# Patient Record
Sex: Female | Born: 1970
Health system: Southern US, Community
[De-identification: ages and names within clinical notes are randomized; demographics above are authoritative.]

## PROBLEM LIST (undated history)

## (undated) DIAGNOSIS — R911 Solitary pulmonary nodule: Secondary | ICD-10-CM

## (undated) DIAGNOSIS — R319 Hematuria, unspecified: Secondary | ICD-10-CM

## (undated) HISTORY — DX: Solitary pulmonary nodule: R91.1

## (undated) HISTORY — PX: OTHER SURGICAL HISTORY: SHX169

## (undated) HISTORY — DX: Hematuria, unspecified: R31.9

---

## 1998-10-25 ENCOUNTER — Other Ambulatory Visit: Admission: RE | Admit: 1998-10-25 | Discharge: 1998-10-25 | Payer: Self-pay | Admitting: Obstetrics and Gynecology

## 1999-10-30 ENCOUNTER — Other Ambulatory Visit: Admission: RE | Admit: 1999-10-30 | Discharge: 1999-10-30 | Payer: Self-pay | Admitting: Obstetrics and Gynecology

## 2000-12-18 ENCOUNTER — Other Ambulatory Visit: Admission: RE | Admit: 2000-12-18 | Discharge: 2000-12-18 | Payer: Self-pay | Admitting: Obstetrics and Gynecology

## 2001-12-18 ENCOUNTER — Emergency Department (HOSPITAL_COMMUNITY): Admission: EM | Admit: 2001-12-18 | Discharge: 2001-12-18 | Payer: Self-pay | Admitting: *Deleted

## 2002-01-05 ENCOUNTER — Other Ambulatory Visit: Admission: RE | Admit: 2002-01-05 | Discharge: 2002-01-05 | Payer: Self-pay | Admitting: Obstetrics and Gynecology

## 2003-02-09 ENCOUNTER — Other Ambulatory Visit: Admission: RE | Admit: 2003-02-09 | Discharge: 2003-02-09 | Payer: Self-pay | Admitting: Obstetrics and Gynecology

## 2004-02-28 ENCOUNTER — Other Ambulatory Visit: Admission: RE | Admit: 2004-02-28 | Discharge: 2004-02-28 | Payer: Self-pay | Admitting: Obstetrics and Gynecology

## 2005-05-16 ENCOUNTER — Other Ambulatory Visit: Admission: RE | Admit: 2005-05-16 | Discharge: 2005-05-16 | Payer: Self-pay | Admitting: Obstetrics and Gynecology

## 2008-07-28 ENCOUNTER — Ambulatory Visit: Payer: Self-pay | Admitting: Genetic Counselor

## 2011-03-19 ENCOUNTER — Ambulatory Visit: Payer: 59 | Attending: Specialist

## 2011-03-19 DIAGNOSIS — M25519 Pain in unspecified shoulder: Secondary | ICD-10-CM | POA: Insufficient documentation

## 2011-03-19 DIAGNOSIS — IMO0001 Reserved for inherently not codable concepts without codable children: Secondary | ICD-10-CM | POA: Insufficient documentation

## 2011-03-19 DIAGNOSIS — M542 Cervicalgia: Secondary | ICD-10-CM | POA: Insufficient documentation

## 2011-03-27 ENCOUNTER — Ambulatory Visit: Payer: Self-pay | Admitting: Rehabilitative and Restorative Service Providers"

## 2011-03-27 ENCOUNTER — Ambulatory Visit: Payer: 59 | Attending: Specialist | Admitting: Rehabilitative and Restorative Service Providers"

## 2011-03-27 DIAGNOSIS — IMO0001 Reserved for inherently not codable concepts without codable children: Secondary | ICD-10-CM | POA: Insufficient documentation

## 2011-03-27 DIAGNOSIS — M542 Cervicalgia: Secondary | ICD-10-CM | POA: Insufficient documentation

## 2011-03-27 DIAGNOSIS — M25519 Pain in unspecified shoulder: Secondary | ICD-10-CM | POA: Insufficient documentation

## 2011-03-30 ENCOUNTER — Ambulatory Visit: Payer: 59 | Admitting: Rehabilitative and Restorative Service Providers"

## 2011-04-06 ENCOUNTER — Ambulatory Visit: Payer: 59 | Admitting: Rehabilitative and Restorative Service Providers"

## 2011-04-11 ENCOUNTER — Ambulatory Visit: Payer: 59 | Admitting: Physical Therapy

## 2011-04-12 ENCOUNTER — Ambulatory Visit: Payer: 59 | Admitting: Physical Therapy

## 2011-04-17 ENCOUNTER — Encounter: Payer: 59 | Admitting: Rehabilitative and Restorative Service Providers"

## 2011-04-19 ENCOUNTER — Encounter: Payer: 59 | Admitting: Rehabilitative and Restorative Service Providers"

## 2013-10-05 ENCOUNTER — Emergency Department (HOSPITAL_COMMUNITY)
Admission: EM | Admit: 2013-10-05 | Discharge: 2013-10-05 | Disposition: A | Discharge status: Home / Self Care | Payer: 59 | Source: Home / Self Care | Attending: Emergency Medicine | Admitting: Emergency Medicine

## 2013-10-05 ENCOUNTER — Encounter (HOSPITAL_COMMUNITY): Payer: Self-pay | Admitting: Emergency Medicine

## 2013-10-05 DIAGNOSIS — J019 Acute sinusitis, unspecified: Secondary | ICD-10-CM

## 2013-10-05 LAB — POCT RAPID STREP A: Streptococcus, Group A Screen (Direct): NEGATIVE

## 2013-10-05 MED ORDER — HYDROCOD POLST-CHLORPHEN POLST 10-8 MG/5ML PO LQCR: 5.0000 mL | Freq: Two times a day (BID) | ORAL | Status: DC | PRN

## 2013-10-05 MED ORDER — AMOXICILLIN-POT CLAVULANATE 875-125 MG PO TABS: 1.0000 | ORAL_TABLET | Freq: Two times a day (BID) | ORAL | Status: DC

## 2013-10-05 NOTE — Discharge Instructions (Signed)

## 2013-10-05 NOTE — ED Provider Notes (Signed)
  Chief Complaint   Chief Complaint  Patient presents with  . Cough    History of Present Illness   Leah Wu is a 43 year old female who's had a one-week history of a cough with some light green, bloody sputum, low-grade fever, nasal congestion, sinus pressure, headache, ear congestion, and sore throat. She's had no specific exposures but has flown to [place] several times.  Review of Systems   Other than as noted above, the patient denies any of the following symptoms: Systemic:  No fevers, chills, sweats, or myalgias. Eye:  No redness or discharge. ENT:  No ear pain, headache, nasal congestion, drainage, sinus pressure, or sore throat. Neck:  No neck pain, stiffness, or swollen glands. Lungs:  No cough, sputum production, hemoptysis, wheezing, chest tightness, shortness of breath or chest pain. GI:  No abdominal pain, nausea, vomiting or diarrhea.  Bliss   Past medical history, family history, social history, meds, and allergies were reviewed. Her only medication is birth control pills.  Physical exam   Vital signs:  BP 125/64  Pulse 76  Temp(Src) 98.5 F (36.9 C) (Oral)  Resp 16  SpO2 100%  LMP 08/05/2013 General:  Alert and oriented.  In no distress.  Skin warm and dry. Eye:  No conjunctival injection or drainage. Lids were normal. ENT:  TMs and canals were normal, without erythema or inflammation.  Nasal mucosa was clear and uncongested, without drainage.  Mucous membranes were moist.  Pharynx was erythematous with no exudate or drainage.  There were no oral ulcerations or lesions. Neck:  Supple, no adenopathy, tenderness or mass. Lungs:  No respiratory distress.  Lungs were clear to auscultation, without wheezes, rales or rhonchi.  Breath sounds were clear and equal bilaterally.  Heart:  Regular rhythm, without gallops, murmers or rubs. Skin:  Clear, warm, and dry, without rash or lesions.  Labs   Results for orders placed during the hospital encounter of  10/05/13  POCT RAPID STREP A ([facility] C[facility] ONLY)      Result Value Range   Streptococcus, Group A Screen (Direct) NEGATIVE  NEGATIVE    Assessment     The encounter diagnosis was Acute sinusitis.  Plan    1.  Meds:  The following meds were prescribed:   Discharge Medication List as of 10/05/2013  4:05 PM    START taking these medications   Details  amoxicillin-clavulanate (AUGMENTIN) 875-125 MG per tablet Take 1 tablet by mouth 2 (two) times daily., Starting 10/05/2013, Until Discontinued, Normal    chlorpheniramine-HYDROcodone (TUSSIONEX) 10-8 MG/5ML LQCR Take 5 mLs by mouth every 12 (twelve) hours as needed for cough., Starting 10/05/2013, Until Discontinued, Normal        2.  Patient Education/Counseling:  The patient was given appropriate handouts, self care instructions, and instructed in symptomatic relief.  Instructed to get extra fluids, rest, and use a cool mist vaporizer.   3.  Follow up:  The patient was told to follow up here if no better in 3 to 4 days, or sooner if becoming worse in any way, and given some red flag symptoms such as increasing fever, difficulty breathing, chest pain, or persistent vomiting which would prompt immediate return.  Follow up here as needed.      Harden Mo, MD 10/05/13 [phone]

## 2013-10-05 NOTE — ED Notes (Signed)
Pt  Reports  Symptoms  Of  Cough   sorethroat     -  Pt  Reports   Symptoms  Persisting  X  2  Weeks   Cough  Is  Now  Becoming  Productive

## 2013-10-07 LAB — CULTURE, GROUP A STREP

## 2013-11-30 ENCOUNTER — Ambulatory Visit (INDEPENDENT_AMBULATORY_CARE_PROVIDER_SITE_OTHER): Payer: Commercial Managed Care - PPO | Admitting: Emergency Medicine

## 2013-11-30 ENCOUNTER — Ambulatory Visit: Payer: Commercial Managed Care - PPO

## 2013-11-30 VITALS — BP 110/72 | HR 70 | Temp 98.9°F | Resp 18 | Ht 64.0 in | Wt 132.0 lb

## 2013-11-30 DIAGNOSIS — M79676 Pain in unspecified toe(s): Secondary | ICD-10-CM

## 2013-11-30 DIAGNOSIS — M79609 Pain in unspecified limb: Secondary | ICD-10-CM

## 2013-11-30 DIAGNOSIS — S92919A Unspecified fracture of unspecified toe(s), initial encounter for closed fracture: Secondary | ICD-10-CM

## 2013-11-30 NOTE — Progress Notes (Signed)
[facility] [address] [place] [address] [phone]  Date:  11/30/2013   Name:  Leah Wu   DOB:  1971/03/28   MRN:  [id number]  PCP:  Darlyn Chamber, MD    Chief Complaint: Toe Injury   History of Present Illness:  Leah Wu is a 43 y.o. very pleasant female patient who presents with the following:  Dropped a piece of firewood on her right small toe a week ago.  Swelling and ecchymosis is resolved.  Still having significant pain the toe.  Increases with weight bearing and standing.  No improvement with over the counter medications or other home remedies.  Denies other complaint or health concern today.   There are no active problems to display for this patient.   History reviewed. No pertinent past medical history.  Past Surgical History  Procedure Laterality Date  . Lasix      History  Substance Use Topics  . Smoking status: Never Smoker   . Smokeless tobacco: Not on file  . Alcohol Use: No    Family History  Problem Relation Age of Onset  . Atrial fibrillation Mother   . Hypertension Father   . Hyperlipidemia Father   . Heart disease Father   . Cancer Sister   . Stroke Maternal Grandfather   . Diabetes Maternal Grandfather   . Cancer Paternal Grandmother   . Stroke Paternal Grandfather     Allergies  Allergen Reactions  . Sulfur     Medication list has been reviewed and updated.  Current Outpatient Prescriptions on File Prior to Visit  Medication Sig Dispense Refill  . amoxicillin-clavulanate (AUGMENTIN) 875-125 MG per tablet Take 1 tablet by mouth 2 (two) times daily.  20 tablet  0  . chlorpheniramine-HYDROcodone (TUSSIONEX) 10-8 MG/5ML LQCR Take 5 mLs by mouth every 12 (twelve) hours as needed for cough.  60 mL  0   No current facility-administered medications on file prior to visit.    Review of Systems:  As per HPI, otherwise negative.    Physical Examination: Filed Vitals:   11/30/13 1434  BP:  110/72  Pulse: 70  Temp: 98.9 F (37.2 C)  Resp: 18   Filed Vitals:   11/30/13 1434  Height: 5\' 4"  (1.626 m)  Weight: 132 lb (59.875 kg)   Body mass index is 22.65 kg/(m^2). Ideal Body Weight: Weight in (lb) to have BMI = 25: 145.3   GEN: WDWN, NAD, Non-toxic, Alert & Oriented x 3 HEENT: Atraumatic, Normocephalic.  Ears and Nose: No external deformity. EXTR: No clubbing/cyanosis/edema NEURO: Normal gait.  PSYCH: Normally interactive. Conversant. Not depressed or anxious appearing.  Calm demeanor.  Right foot:  Tender small toe.  No deformity.  Assessment and Plan: Fractured small toe Buddy tape  Signed,  Ellison Carwin, MD  [facility] reading (PRIMARY) by  Dr. Ouida Sills.  Fractured toe .

## 2013-11-30 NOTE — Patient Instructions (Signed)
Toe Fracture  Your caregiver has diagnosed you as having a fractured toe. A toe fracture is a break in the bone of a toe. "Buddy taping" is a way of splinting your broken toe, by taping the broken toe to the toe next to it. This "buddy taping" will keep the injured toe from moving beyond normal range of motion. Buddy taping also helps the toe heal in a more normal alignment. It may take 6 to 8 weeks for the toe injury to heal.  HOME CARE INSTRUCTIONS    Leave your toes taped together for as long as directed by your caregiver or until you see a doctor for a follow-up examination. You can change the tape after bathing. Always use a small piece of gauze or cotton between the toes when taping them together. This will help the skin stay dry and prevent infection.   Apply ice to the injury for 15-20 minutes each hour while awake for the first 2 days. Put the ice in a plastic bag and place a towel between the bag of ice and your skin.   After the first 2 days, apply heat to the injured area. Use heat for the next 2 to 3 days. Place a heating pad on the foot or soak the foot in warm water as directed by your caregiver.   Keep your foot elevated as much as possible to lessen swelling.   Wear sturdy, supportive shoes. The shoes should not pinch the toes or fit tightly against the toes.   Your caregiver may prescribe a rigid shoe if your foot is very swollen.   Your may be given crutches if the pain is too great and it hurts too much to walk.   Only take over-the-counter or prescription medicines for pain, discomfort, or fever as directed by your caregiver.   If your caregiver has given you a follow-up appointment, it is very important to keep that appointment. Not keeping the appointment could result in a chronic or permanent injury, pain, and disability. If there is any problem keeping the appointment, you must call back to this facility for assistance.  SEEK MEDICAL CARE IF:    You have increased pain or swelling,  not relieved with medications.   The pain does not get better after 1 week.   Your injured toe is cold when the others are warm.  SEEK IMMEDIATE MEDICAL CARE IF:    The toe becomes cold, numb, or white.   The toe becomes hot (inflamed) and red.  Document Released: 09/07/2000 Document Revised: 12/03/2011 Document Reviewed: 04/26/2008  ExitCare Patient Information 2014 [employer].

## 2014-03-02 ENCOUNTER — Encounter: Payer: Self-pay | Admitting: Podiatry

## 2014-03-02 ENCOUNTER — Ambulatory Visit (INDEPENDENT_AMBULATORY_CARE_PROVIDER_SITE_OTHER): Payer: Commercial Managed Care - PPO

## 2014-03-02 ENCOUNTER — Ambulatory Visit (INDEPENDENT_AMBULATORY_CARE_PROVIDER_SITE_OTHER): Payer: Commercial Managed Care - PPO | Admitting: Podiatry

## 2014-03-02 VITALS — BP 121/61 | HR 78 | Resp 16 | Ht 64.0 in | Wt 154.0 lb

## 2014-03-02 DIAGNOSIS — L600 Ingrowing nail: Secondary | ICD-10-CM

## 2014-03-02 DIAGNOSIS — S92919A Unspecified fracture of unspecified toe(s), initial encounter for closed fracture: Secondary | ICD-10-CM

## 2014-03-02 DIAGNOSIS — L608 Other nail disorders: Secondary | ICD-10-CM

## 2014-03-02 NOTE — Progress Notes (Signed)
   Subjective:    Patient ID: Leah Wu, female    DOB: 1971-01-17, 43 y.o.   MRN: [id number]  HPI Comments: "I have a bad nail"  Patient c/o tender 2nd toe left for few weeks. The nail is discolored and thick. She is an active hiker and feels the toes hitting the ends of her shoes sometimes. The nail was loose last night so she trimmed it down.   Also, she broke the 5th toe right in March 2015 and still appears swollen and red and sore sometimes with shoes.     Review of Systems  All other systems reviewed and are negative.      Objective:   Physical Exam: I have reviewed her past medical history medications allergies surgeries social history and review of systems. Pulses are strongly palpable bilateral. Neurologic sensorium is intact per since once the monofilament. Deep tendon reflexes are intact brisk and equal bilateral. Muscle strength is 5 over 5 dorsiflexors plantar flexors inverters everters all intrinsic musculature is intact. Orthopedic evaluation Mr. is all joints distal to the ankle a full range of motion without crepitus mild flexible hammertoe deformities are noted bilateral but are asymptomatic. Radiographic evaluation of the foot demonstrates a compression fracture of the head of the proximal phalanx of the right foot is still healing. It is not dislocated and is not comminuted. Cutaneous evaluation demonstrates supple well hydrated cutis with exception of the nail plates to the second digit of the left foot which is short and discolored and appears to be sharply incurvated. The remainder of her nail plates are nice and flat clear and clean. Second digit of the left foot demonstrates minimal callus to the distal aspect of the toe. She appears to have some bleeding and what is remaining of the nail plate. And it is of a [place]:.        Assessment & Plan:  Assessment: Fracture fifth digit right foot non-comminuted nondisplaced healing. Painful ingrown nail second  digit of the left foot with probable nail dystrophy and possible onychomycosis.  Plan: Discussed etiology pathology conservative versus surgical therapies at this point I performed a total nail avulsion to the second digit of the left foot this is temporary in nature and we sent the nail plate for biopsy. I will followup with her in a week or so just to reevaluate the toe to make sure that is healing well before she leaves on her trip to [place].

## 2014-03-02 NOTE — Patient Instructions (Signed)

## 2014-03-05 ENCOUNTER — Telehealth: Payer: Self-pay | Admitting: *Deleted

## 2014-03-05 NOTE — Telephone Encounter (Signed)
Nail Fragment 2nd left sent to [facility] for fungal culture/ PAS stain to rule out Fungus.   [facility] [address] [place] [address] Ph: [phone]

## 2014-03-09 ENCOUNTER — Ambulatory Visit (INDEPENDENT_AMBULATORY_CARE_PROVIDER_SITE_OTHER): Payer: Commercial Managed Care - PPO | Admitting: Podiatry

## 2014-03-09 ENCOUNTER — Encounter: Payer: Self-pay | Admitting: Podiatry

## 2014-03-09 VITALS — BP 102/78 | HR 71 | Resp 16 | Wt 136.6 lb

## 2014-03-09 DIAGNOSIS — L608 Other nail disorders: Secondary | ICD-10-CM

## 2014-03-09 NOTE — Progress Notes (Signed)
She presents today for followup of total nail avulsion with biopsy second digit left foot. She denies fever chills nausea vomiting muscle aches and pains it seems to be doing pretty well. She continues to soak in Betadine warm water to yesterday were she started Epsom salts and water soaks. States it seems to be doing well she covers daily with antibiotic ointment and a Band-Aid.  Objective: Vital signs are stable she is alert and oriented x3 his second digit left foot does not demonstrate any erythema edema saline is drainage or odor and appears to be healing nicely.  Assessment: Well-healing surgical toe second left.  Plan: Continue to soak Epsom salts warm water twice a day and leave open at night covered in today with a small amount of Aquaphor and a Band-Aid. We will notify her once her biopsy comes in and we will have her into the office to discuss our options.

## 2014-03-24 ENCOUNTER — Encounter: Payer: Self-pay | Admitting: Podiatry

## 2014-03-30 ENCOUNTER — Encounter: Payer: Self-pay | Admitting: Podiatry

## 2014-03-30 ENCOUNTER — Ambulatory Visit (INDEPENDENT_AMBULATORY_CARE_PROVIDER_SITE_OTHER): Payer: Commercial Managed Care - PPO | Admitting: Podiatry

## 2014-03-30 ENCOUNTER — Telehealth: Payer: Self-pay | Admitting: *Deleted

## 2014-03-30 VITALS — BP 119/70 | HR 66 | Resp 16

## 2014-03-30 DIAGNOSIS — Z79899 Other long term (current) drug therapy: Secondary | ICD-10-CM

## 2014-03-30 LAB — HEPATIC FUNCTION PANEL
ALT: 12 U/L (ref 0–35)
AST: 17 U/L (ref 0–37)
Albumin: 4 g/dL (ref 3.5–5.2)
Alkaline Phosphatase: 56 U/L (ref 39–117)
Bilirubin, Direct: 0.1 mg/dL (ref 0.0–0.3)
Indirect Bilirubin: 0.3 mg/dL (ref 0.2–1.2)
Total Bilirubin: 0.4 mg/dL (ref 0.2–1.2)
Total Protein: 6.6 g/dL (ref 6.0–8.3)

## 2014-03-30 MED ORDER — TERBINAFINE HCL 250 MG PO TABS: 250.0000 mg | ORAL_TABLET | Freq: Every day | ORAL | Status: DC

## 2014-03-30 NOTE — Telephone Encounter (Signed)
I'm not sure which pharmacy I have on my chart, I want to confirm.  I moved from [place] back to [place].  I know he's going to call in a prescription for me.  Give me a quick call to confirm that.  I called and left her a message that we sent the prescription to [facility].  Call if you have any further questions.

## 2014-03-30 NOTE — Patient Instructions (Signed)

## 2014-03-30 NOTE — Progress Notes (Signed)
She presents today for discussion regarding her fungal results. She states that her second toe appears to be healing quite nicely.  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  Assessment: Onychomycosis second digit left foot with nail dystrophy.  Plan: Treatment with terbinafine. We performed a liver profile should this come back abnormal I will notify her immediately remember to ask her how her trip to [place] went.

## 2014-03-31 ENCOUNTER — Telehealth: Payer: Self-pay | Admitting: *Deleted

## 2014-03-31 NOTE — Telephone Encounter (Signed)
Message copied by Rip Harbour on Wed Mar 31, 2014  1:44 PM ------      Message from: Tyson Dense T      Created: Wed Mar 31, 2014 11:47 AM       Blood work looks good. Continue medication. ------

## 2014-03-31 NOTE — Telephone Encounter (Signed)
Called patient to let her know that her labs were WNL and okay to start medication.

## 2014-05-04 ENCOUNTER — Ambulatory Visit: Payer: Commercial Managed Care - PPO | Admitting: Podiatry

## 2014-05-07 ENCOUNTER — Telehealth: Payer: Self-pay | Admitting: *Deleted

## 2014-05-07 NOTE — Telephone Encounter (Signed)
I returned her call and informed her she will need to schedule an appointment to see Dr. Milinda Pointer.  She said it just seems like I've had a whole lot of appointments lately and I don't want the insurance to stop covering it.  She said, Do I need to call back tomorrow to schedule an appointment?  I told her no, call on Monday.  She stated okay.

## 2014-05-07 NOTE — Telephone Encounter (Signed)
I missed an appointment this week.  Do we actually need an office visit or can I just get him to give orders to have blood drawn?

## 2014-05-11 ENCOUNTER — Encounter: Payer: Self-pay | Admitting: Podiatry

## 2014-05-11 ENCOUNTER — Ambulatory Visit (INDEPENDENT_AMBULATORY_CARE_PROVIDER_SITE_OTHER): Payer: Commercial Managed Care - PPO | Admitting: Podiatry

## 2014-05-11 VITALS — BP 114/87 | HR 68 | Resp 16

## 2014-05-11 DIAGNOSIS — Z79899 Other long term (current) drug therapy: Secondary | ICD-10-CM

## 2014-05-11 MED ORDER — TERBINAFINE HCL 250 MG PO TABS
250.0000 mg | ORAL_TABLET | Freq: Every day | ORAL | Status: DC
Start: 2014-05-11 — End: 2014-10-25

## 2014-05-11 NOTE — Progress Notes (Signed)
She presents today one month status post taking Lamisil for onychomycosis. She denies fever chills nausea vomiting muscle aches and pains itching and rashes. She's finished her first bottle of Lamisil.  Objective: Vital signs are stable she is alert and oriented x3. No change in nail plate second digit of the left foot as of yet.  Assessment: Start her second dose of Lamisil 250 mg #90. She also received a blood requisition form for liver profile. Should this come back abnormal I will notify her immediately otherwise I will followup with her in 4 months area

## 2014-05-12 LAB — HEPATIC FUNCTION PANEL
ALBUMIN: 3.9 g/dL (ref 3.5–5.2)
ALT: 8 U/L (ref 0–35)
AST: 14 U/L (ref 0–37)
Alkaline Phosphatase: 59 U/L (ref 39–117)
BILIRUBIN INDIRECT: 0.2 mg/dL (ref 0.2–1.2)
Bilirubin, Direct: 0.1 mg/dL (ref 0.0–0.3)
Total Bilirubin: 0.3 mg/dL (ref 0.2–1.2)
Total Protein: 6.8 g/dL (ref 6.0–8.3)

## 2014-05-12 LAB — CBC WITH DIFFERENTIAL/PLATELET
Basophils Absolute: 0 10*3/uL (ref 0.0–0.1)
Basophils Relative: 0 % (ref 0–1)
Eosinophils Absolute: 0.1 10*3/uL (ref 0.0–0.7)
Eosinophils Relative: 1 % (ref 0–5)
HCT: 40.9 % (ref 36.0–46.0)
HEMOGLOBIN: 13.6 g/dL (ref 12.0–15.0)
LYMPHS ABS: 2.2 10*3/uL (ref 0.7–4.0)
LYMPHS PCT: 32 % (ref 12–46)
MCH: 28.9 pg (ref 26.0–34.0)
MCHC: 33.3 g/dL (ref 30.0–36.0)
MCV: 86.8 fL (ref 78.0–100.0)
MONOS PCT: 9 % (ref 3–12)
Monocytes Absolute: 0.6 10*3/uL (ref 0.1–1.0)
NEUTROS ABS: 4 10*3/uL (ref 1.7–7.7)
NEUTROS PCT: 58 % (ref 43–77)
Platelets: 297 10*3/uL (ref 150–400)
RBC: 4.71 MIL/uL (ref 3.87–5.11)
RDW: 12.8 % (ref 11.5–15.5)
WBC: 6.9 10*3/uL (ref 4.0–10.5)

## 2014-05-19 ENCOUNTER — Telehealth: Payer: Self-pay | Admitting: *Deleted

## 2014-05-19 NOTE — Telephone Encounter (Signed)
Spoke to patient regarding her blood work results

## 2014-05-19 NOTE — Telephone Encounter (Signed)
Message copied by Dierdre Searles on Wed May 19, 2014  8:53 AM ------      Message from: Tyson Dense T      Created: Mon May 17, 2014 12:33 PM       Blood work looks good continue medication. ------

## 2014-06-23 ENCOUNTER — Encounter: Payer: Self-pay | Admitting: Sports Medicine

## 2014-06-23 ENCOUNTER — Ambulatory Visit (INDEPENDENT_AMBULATORY_CARE_PROVIDER_SITE_OTHER): Payer: 59 | Admitting: Sports Medicine

## 2014-06-23 VITALS — BP 119/76 | Ht 64.0 in | Wt 134.0 lb

## 2014-06-23 DIAGNOSIS — M629 Disorder of muscle, unspecified: Secondary | ICD-10-CM

## 2014-06-23 DIAGNOSIS — M7632 Iliotibial band syndrome, left leg: Secondary | ICD-10-CM

## 2014-06-23 DIAGNOSIS — M2420 Disorder of ligament, unspecified site: Secondary | ICD-10-CM

## 2014-06-23 DIAGNOSIS — M7630 Iliotibial band syndrome, unspecified leg: Secondary | ICD-10-CM | POA: Insufficient documentation

## 2014-06-23 NOTE — Patient Instructions (Signed)
1. Hip abduction exercise: 6-8 times twice daily.  2. Standing hip rotation exercise: 6-8 times twice daily.  3. Lateral step-ups: 6-8 times twice daily.  4. Crossover step-ups: 6-8 times twice daily.  5. Partial side planks: Hold for 5 seconds, back down, relax. Repeat 6-8 times once daily for 2 weeks, then twice daily.  6. 30 yard lateral shuffle 5 times back and forth at the end of runs approximately 2-3 times per week  -Plan follow-up in 2 months.

## 2014-06-23 NOTE — Progress Notes (Signed)
   Subjective:    Patient ID: Leah Wu, female    DOB: [birth year], 43 y.o.   MRN: [id number]  HPI Leah Wu is a 43yo female new patient who presents with bilateral, though left greater than right knee pain. Onset was 2 weeks ago when she was running on the beach and jumped over a gully on the beach, landing awkwardly on the left knee. She felt she landed in a slightly twisted manner, causing her to fall to the ground, striking both her knees anteriorly on the sand. She was able to continue running after this happened. She denies any swelling, giving way, or locking in the left knee. Location of pain is primarily antero-infero-lateral left knee in the region in which the IT band inserts. She seeks an opinion about getting back to running. She has tried icing and an OTC brace with some relief. Jumping or running may aggravate her symptoms. She denies any pain that wakes her from sleep. She takes occasional Aleeve. She denies any prior knee injuries or surgeries or any other PMHx.  Past medical history, social history, medications, and allergies were reviewed and are up to date in the chart.  Review of Systems 7 point review of systems was performed and was otherwise negative unless noted in the history of present illness.    Objective:   Physical Exam BP 119/76  Ht 5\' 4"  (1.626 m)  Wt 134 lb (60.782 kg)  BMI 22.99 kg/m2 GEN: The patient is well-developed well-nourished female and in no acute distress.  She is awake alert and oriented x3. SKIN: warm and well-perfused, no rash  EXTR: No lower extremity edema or calf tenderness Neuro: Strength 5/5 globally. Sensation intact throughout. DTRs 2/4 bilaterally. No focal deficits. Vasc: +2 bilateral distal pulses. No edema.  MSK: Examination of the bilateral knees reveals no obvious swelling or overlying erythema. ROM is from 0-140 bilaterally without pain. Examination of the right knee is unremarkable. Examination of the left knee reveals  TTP over the infero-lateral insertion of the IT band and infero-medial insertion of the pes anserine region. Less TTP at [facility]. No laxity of LCL or MCL. Neg lachman's. Good hamstring strength. No patellar ballottment. Patellar and Quad Tendons grossly intact. No joint line tenderness. +Weak hip abductors bilaterally with leg bend and straight. +Quad weakness bilaterally.     Assessment & Plan:  Please see problem based assessment and plan in the problem list.

## 2014-06-23 NOTE — Assessment & Plan Note (Signed)
1. Hip abduction exercise: 6-8 times twice daily.  2. Standing hip rotation exercise: 6-8 times twice daily.  3. Lateral step-ups: 6-8 times twice daily.  4. Crossover step-ups: 6-8 times twice daily.  5. Partial side planks: Hold for 5 seconds, back down, relax. Repeat 6-8 times once daily for 2 weeks, then twice daily.  6. 30 yard lateral shuffle 5 times back and forth at the end of runs approximately 2-3 times per week  -Plan follow-up in 2 months.

## 2014-08-09 ENCOUNTER — Ambulatory Visit (INDEPENDENT_AMBULATORY_CARE_PROVIDER_SITE_OTHER): Payer: Commercial Managed Care - PPO | Admitting: Family Medicine

## 2014-08-09 ENCOUNTER — Encounter: Payer: Self-pay | Admitting: Family Medicine

## 2014-08-09 VITALS — BP 118/74 | HR 66 | Temp 98.6°F | Resp 16 | Ht 64.5 in | Wt 131.0 lb

## 2014-08-09 DIAGNOSIS — J029 Acute pharyngitis, unspecified: Secondary | ICD-10-CM

## 2014-08-09 DIAGNOSIS — F329 Major depressive disorder, single episode, unspecified: Secondary | ICD-10-CM

## 2014-08-09 DIAGNOSIS — R110 Nausea: Secondary | ICD-10-CM

## 2014-08-09 DIAGNOSIS — F32A Depression, unspecified: Secondary | ICD-10-CM | POA: Insufficient documentation

## 2014-08-09 LAB — CBC WITH DIFFERENTIAL/PLATELET
BASOS PCT: 0 % (ref 0–1)
Basophils Absolute: 0 10*3/uL (ref 0.0–0.1)
Eosinophils Absolute: 0.1 10*3/uL (ref 0.0–0.7)
Eosinophils Relative: 1 % (ref 0–5)
HCT: 41.3 % (ref 36.0–46.0)
HEMOGLOBIN: 13.8 g/dL (ref 12.0–15.0)
Lymphocytes Relative: 20 % (ref 12–46)
Lymphs Abs: 2 10*3/uL (ref 0.7–4.0)
MCH: 29.6 pg (ref 26.0–34.0)
MCHC: 33.4 g/dL (ref 30.0–36.0)
MCV: 88.6 fL (ref 78.0–100.0)
MONOS PCT: 6 % (ref 3–12)
MPV: 10.7 fL (ref 9.4–12.4)
Monocytes Absolute: 0.6 10*3/uL (ref 0.1–1.0)
NEUTROS ABS: 7.2 10*3/uL (ref 1.7–7.7)
NEUTROS PCT: 73 % (ref 43–77)
Platelets: 270 10*3/uL (ref 150–400)
RBC: 4.66 MIL/uL (ref 3.87–5.11)
RDW: 13.5 % (ref 11.5–15.5)
WBC: 9.9 10*3/uL (ref 4.0–10.5)

## 2014-08-09 LAB — COMPREHENSIVE METABOLIC PANEL
ALBUMIN: 3.9 g/dL (ref 3.5–5.2)
ALK PHOS: 68 U/L (ref 39–117)
ALT: 12 U/L (ref 0–35)
AST: 17 U/L (ref 0–37)
BILIRUBIN TOTAL: 0.3 mg/dL (ref 0.2–1.2)
BUN: 7 mg/dL (ref 6–23)
CO2: 26 mEq/L (ref 19–32)
Calcium: 8.8 mg/dL (ref 8.4–10.5)
Chloride: 102 mEq/L (ref 96–112)
Creat: 0.86 mg/dL (ref 0.50–1.10)
GLUCOSE: 83 mg/dL (ref 70–99)
POTASSIUM: 4 meq/L (ref 3.5–5.3)
SODIUM: 137 meq/L (ref 135–145)
Total Protein: 7 g/dL (ref 6.0–8.3)

## 2014-08-09 LAB — POCT RAPID STREP A (OFFICE): RAPID STREP A SCREEN: NEGATIVE

## 2014-08-09 MED ORDER — FLUOXETINE HCL 20 MG PO TABS: 20.0000 mg | ORAL_TABLET | Freq: Every day | ORAL | Status: DC

## 2014-08-09 MED ORDER — ONDANSETRON HCL 8 MG PO TABS: 8.0000 mg | ORAL_TABLET | Freq: Three times a day (TID) | ORAL | Status: DC | PRN

## 2014-08-09 NOTE — Progress Notes (Signed)
[facility] [address] [place] [address] [phone]  Date:  08/09/2014   Name:  Leah Wu   DOB:  Sep 05, 1971   MRN:  [id number]  PCP:  Darlyn Chamber, MD    Chief Complaint: Sore Throat; Nausea; Fever; and Cough   History of Present Illness:  Leah Wu is a 43 y.o. very pleasant female patient who presents with the following:  Here today with illness.  She is generally in good health.  This is monday- on Friday she noted onset of a ST.  However her sx have changed over the last few days.  Her throat feels "thick," and she has noted nausea, PND, and a fever up to 99.9 over the weekend.  Mild cough- not much.   No vomiting but she has "gotten really really close."  She notes poor appetite.   She has had some loose stools.   No sick contacts.  LMP a couple of weeks ago- she feels very certain that she could not be pregnant Never a smoker.    She has noted some more stress in her life recently.  She has noted some milder sx of depression and is feeling lonely over the last several months.  She has gotten divorced, moved and changed jobs recently.  She does not have any SI, but admits she just wants to sleep more. She does not feel motivated to go out and do things for fun.  Sometimes tearful.  She does not drink or use any drugs.  She would be interested in trying a medication for depression such as an SSRI.  There is a family history of substance abuse so she wants to avoid anything addictive if at all possible   Patient Active Problem List   Diagnosis Date Noted  . 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  Past Surgical History  Procedure Laterality Date  . Lasix      History  Substance Use Topics  . Smoking status: Never Smoker   . Smokeless tobacco: Not on file  . Alcohol Use: No    Family History  Problem Relation Age of Onset  . Atrial fibrillation Mother   . Hypertension Father   . Hyperlipidemia  Father   . Heart disease Father   . Cancer Sister   . Stroke Maternal Grandfather   . Diabetes Maternal Grandfather   . Cancer Paternal Grandmother   . Stroke Paternal Grandfather     Allergies  Allergen Reactions  . Sulfur     Medication list has been reviewed and updated.  Current Outpatient Prescriptions on File Prior to Visit  Medication Sig Dispense Refill  . norgestrel-ethinyl estradiol (LO/OVRAL,CRYSELLE) 0.3-30 MG-MCG tablet Take 1 tablet by mouth daily.    Marland Kitchen terbinafine (LAMISIL) 250 MG tablet Take 1 tablet (250 mg total) by mouth daily. 90 tablet 0   No current facility-administered medications on file prior to visit.    Review of Systems:  As per HPI- otherwise negative. She has used some tylenol OTC- last dose was last night She has noted some decreased appetite over the last few weeks.  She is just finishing up a 4 month course of lamisil for toenail fungus.  No abdominal pain   Physical Examination: Filed Vitals:   08/09/14 0931  BP: 118/74  Pulse: 66  Temp: 98.6 F (37 C)  Resp: 16   Filed Vitals:   08/09/14 0931  Height: 5' 4.5" (  1.638 m)  Weight: 131 lb (59.421 kg)   Body mass index is 22.15 kg/(m^2). Ideal Body Weight: Weight in (lb) to have BMI = 25: 147.6  GEN: WDWN, NAD, Non-toxic, A & O x 3, looks well HEENT: Atraumatic, Normocephalic. Neck supple. No masses, No LAD.  Bilateral TM wnl, oropharynx normal.  PEERL,EOMI.   Ears and Nose: No external deformity. CV: RRR, No M/G/R. No JVD. No thrill. No extra heart sounds. PULM: CTA B, no wheezes, crackles, rhonchi. No retractions. No resp. distress. No accessory muscle use. ABD: S, NT, ND. No rebound. No HSM. EXTR: No c/c/e NEURO Normal gait.  PSYCH: Normally interactive. Conversant. Not depressed or anxious appearing.  Calm demeanor.   Results for orders placed or performed in visit on 08/09/14  POCT rapid strep A  Result Value Ref Range   Rapid Strep A Screen Negative Negative     Assessment and Plan: Acute pharyngitis, unspecified pharyngitis type - Plan: POCT rapid strep A, Culture, Group A Strep  Nausea without vomiting - Plan: CBC with Differential, Comprehensive metabolic panel, ondansetron (ZOFRAN) 8 MG tablet  Depression - Plan: FLUoxetine (PROZAC) 20 MG tablet  Reassured- seems to have a viral URI.  Wait throat culture and treat sx as needed Await CMP to ensure her lfts are ok as she is on lamisil She would like to start medication for depression.  Will start prozac at 20 mg, increase to 40 as needed See patient instructions for more details.     Signed Lamar Blinks, MD

## 2014-08-09 NOTE — Patient Instructions (Addendum)
I will be in touch with your labs.  Rest, drink plenty of fluids, and use ibuprofen or tylenol as needed You can use the zofran as needed for nausea.  Let me know if you have any abdominal pain or if you are otherwise getting worse.    Also, we are going to start prozac for your depression.  Start at 20 mg and increase to 40 after a couple of weeks.   Make sure to exercise, get some sunshine daily, and make plans with friends- even if you don't really want to! Please touch base with me in about 1 month and let me know how you are doing

## 2014-08-11 ENCOUNTER — Encounter: Payer: Self-pay | Admitting: Family Medicine

## 2014-08-11 LAB — CULTURE, GROUP A STREP: Organism ID, Bacteria: NORMAL

## 2014-08-16 ENCOUNTER — Ambulatory Visit (INDEPENDENT_AMBULATORY_CARE_PROVIDER_SITE_OTHER): Payer: Commercial Managed Care - PPO | Admitting: Family Medicine

## 2014-08-16 ENCOUNTER — Encounter: Payer: Self-pay | Admitting: Family Medicine

## 2014-08-16 VITALS — BP 118/66 | HR 77 | Temp 98.8°F | Resp 16 | Ht 64.5 in | Wt 128.2 lb

## 2014-08-16 DIAGNOSIS — R634 Abnormal weight loss: Secondary | ICD-10-CM

## 2014-08-16 DIAGNOSIS — K2970 Gastritis, unspecified, without bleeding: Secondary | ICD-10-CM

## 2014-08-16 DIAGNOSIS — B370 Candidal stomatitis: Secondary | ICD-10-CM

## 2014-08-16 DIAGNOSIS — R110 Nausea: Secondary | ICD-10-CM

## 2014-08-16 LAB — THYROID PANEL WITH TSH
Free Thyroxine Index: 2.5 (ref 1.4–3.8)
T3 Uptake: 29 % (ref 22–35)
T4, Total: 8.5 ug/dL (ref 4.5–12.0)
TSH: 0.785 u[IU]/mL (ref 0.350–4.500)

## 2014-08-16 MED ORDER — NYSTATIN 100000 UNIT/ML MT SUSP: 5.0000 mL | Freq: Four times a day (QID) | OROMUCOSAL | Status: DC

## 2014-08-16 NOTE — Patient Instructions (Signed)
Take zantac twice a day Start probiotic    Gastritis, Adult Gastritis is soreness and swelling (inflammation) of the lining of the stomach. Gastritis can develop as a sudden onset (acute) or long-term (chronic) condition. If gastritis is not treated, it can lead to stomach bleeding and ulcers. CAUSES  Gastritis occurs when the stomach lining is weak or damaged. Digestive juices from the stomach then inflame the weakened stomach lining. The stomach lining may be weak or damaged due to viral or bacterial infections. One common bacterial infection is the Helicobacter pylori infection. Gastritis can also result from excessive alcohol consumption, taking certain medicines, or having too much acid in the stomach.  SYMPTOMS  In some cases, there are no symptoms. When symptoms are present, they may include:  Pain or a burning sensation in the upper abdomen.  Nausea.  Vomiting.  An uncomfortable feeling of fullness after eating. DIAGNOSIS  Your caregiver may suspect you have gastritis based on your symptoms and a physical exam. To determine the cause of your gastritis, your caregiver may perform the following:  Blood or stool tests to check for the H pylori bacterium.  Gastroscopy. A thin, flexible tube (endoscope) is passed down the esophagus and into the stomach. The endoscope has a light and camera on the end. Your caregiver uses the endoscope to view the inside of the stomach.  Taking a tissue sample (biopsy) from the stomach to examine under a microscope. TREATMENT  Depending on the cause of your gastritis, medicines may be prescribed. If you have a bacterial infection, such as an H pylori infection, antibiotics may be given. If your gastritis is caused by too much acid in the stomach, H2 blockers or antacids may be given. Your caregiver may recommend that you stop taking aspirin, ibuprofen, or other nonsteroidal anti-inflammatory drugs (NSAIDs). HOME CARE INSTRUCTIONS  Only take  over-the-counter or prescription medicines as directed by your caregiver.  If you were given antibiotic medicines, take them as directed. Finish them even if you start to feel better.  Drink enough fluids to keep your urine clear or pale yellow.  Avoid foods and drinks that make your symptoms worse, such as:  Caffeine or alcoholic drinks.  Chocolate.  Peppermint or mint flavorings.  Garlic and onions.  Spicy foods.  Citrus fruits, such as oranges, lemons, or limes.  Tomato-based foods such as sauce, chili, salsa, and pizza.  Fried and fatty foods.  Eat small, frequent meals instead of large meals. SEEK IMMEDIATE MEDICAL CARE IF:   You have black or dark red stools.  You vomit blood or material that looks like coffee grounds.  You are unable to keep fluids down.  Your abdominal pain gets worse.  You have a fever.  You do not feel better after 1 week.  You have any other questions or concerns. MAKE SURE YOU:  Understand these instructions.  Will watch your condition.  Will get help right away if you are not doing well or get worse. Document Released: 09/04/2001 Document Revised: 03/11/2012 Document Reviewed: 10/24/2011 [facility] Patient Information 2015 [place], [place]. This information is not intended to replace advice given to you by your health care provider. Make sure you discuss any questions you have with your health care provider.

## 2014-08-16 NOTE — Progress Notes (Signed)
Subjective:     Patient ID: Leah Wu, female   DOB: [birth year], 43 y.o.   MRN: [id number]  HPI Patient presents today for follow up of nausea and weight loss. She continues to have nausea and lack of appetite. The nausea has been going on for 10-12 days. She was seen 1 week ago by Dr. Janett Billow Copland. At that time, she had a sore throat and had negative rapid strep and culture. CBC and CMET were normal She forces herself to eat. She does not vomit. Has tried zofran, but does not really like to take medicine and did not get significant relief with zofran. Started zantac last night. Bowel movements have become somewhat irregular. She believes this is due to decreased fiber consumption. Her nausea is not worse with eating. She tries to eat occasional bland food and stay hydrated. She does not have abdominal pain.   She has noticed a white coating on her tongue in the last couple of days, she thinks it might be oral thrush. She has never had this before. She has not been on any recent antibiotics. She is not currently sexually active and has not been for several years. She has had annual STI testing at her gynecologist for many years.   Her cat is currently under treatment for hyperthyroidism. She applies a cream to the cat's ear with a gloved finger.    Review of Systems  Constitutional: Positive for appetite change, fatigue and unexpected weight change. Negative for fever, chills and diaphoresis.  HENT: Positive for mouth sores. Negative for congestion, ear pain, postnasal drip, rhinorrhea and sore throat.   Respiratory: Negative for cough and shortness of breath.   Cardiovascular: Negative for chest pain.  Gastrointestinal: Positive for nausea. Negative for vomiting, abdominal pain, constipation, blood in stool and abdominal distention. Diarrhea: several loose stools.  Genitourinary: Negative for dysuria, urgency, frequency, hematuria, vaginal discharge and vaginal pain.  Neurological:  Positive for dizziness. Negative for headaches.      Objective:   Physical Exam  Constitutional: She appears well-developed and well-nourished. No distress.  HENT:  Head: Normocephalic and atraumatic.  Right Ear: Tympanic membrane, external ear and ear canal normal.  Left Ear: Tympanic membrane, external ear and ear canal normal.  Nose: Nose normal.  Mouth/Throat: Uvula is midline and mucous membranes are normal.  Tongue with thin greenish coating (patient has recently consumed blue gatorade). She reports the coating had been white previously.   Skin: She is not diaphoretic.  Vitals reviewed. BP 118/66 mmHg  Pulse 77  Temp(Src) 98.8 F (37.1 C) (Oral)  Resp 16  Ht 5' 4.5" (1.638 m)  Wt 128 lb 3.2 oz (58.151 kg)  BMI 21.67 kg/m2  SpO2 97%  LMP 07/26/2014 Weight 08/09/14- 131    Assessment:     1. Nausea without vomiting -Not sure if this is more of a gastritis. Will try zantac 150 mg po BID -Encouraged her to eat bland foods regularly- every 2-3 hours while awake. - Ambulatory referral to Gastroenterology  2. Thrush, oral - nystatin (MYCOSTATIN) 100000 UNIT/ML suspension; Take 5 mLs (500,000 Units total) by mouth 4 (four) times daily.  Dispense: 120 mL; Refill: 0  3. Gastritis - Ambulatory referral to Gastroenterology  4. Loss of weight - Thyroid Panel With TSH - Ambulatory referral to Gastroenterology  Elby Beck, FNP-BC  [facility] [place] [facility]  08/16/2014 9:35 PM     Plan:       See above  with assessment.

## 2014-08-24 ENCOUNTER — Ambulatory Visit: Payer: 59 | Admitting: Sports Medicine

## 2014-09-09 ENCOUNTER — Ambulatory Visit (INDEPENDENT_AMBULATORY_CARE_PROVIDER_SITE_OTHER): Payer: Commercial Managed Care - PPO | Admitting: Podiatry

## 2014-09-09 ENCOUNTER — Encounter: Payer: Self-pay | Admitting: Podiatry

## 2014-09-09 VITALS — BP 110/66 | HR 62 | Resp 16

## 2014-09-09 DIAGNOSIS — Z79899 Other long term (current) drug therapy: Secondary | ICD-10-CM

## 2014-09-09 NOTE — Progress Notes (Signed)
   Subjective:    Patient ID: Leah Wu, female    DOB: 06-21-1971, 43 y.o.   MRN: [id number]  HPI PT PRESENTS FOR NAIL RECHECK, FINISHED LAMISIL, LEFT 2ND NAIL IMPROVED.   Review of Systems     Objective:   Physical Exam: Pulses are palpable left. Left second toenail appears to be growing out normally. She is proximally 75% clear.        Assessment & Plan:  Assessment well-healing onychomycosis.  Plan: Discontinue the use of the Lamisil follow up with me with any regression.

## 2014-10-25 ENCOUNTER — Encounter: Payer: Self-pay | Admitting: Family Medicine

## 2014-10-25 ENCOUNTER — Ambulatory Visit (INDEPENDENT_AMBULATORY_CARE_PROVIDER_SITE_OTHER): Payer: Commercial Managed Care - PPO | Admitting: Family Medicine

## 2014-10-25 VITALS — BP 125/78 | HR 68 | Temp 98.9°F | Resp 16 | Ht 64.5 in | Wt 136.0 lb

## 2014-10-25 DIAGNOSIS — N3090 Cystitis, unspecified without hematuria: Secondary | ICD-10-CM

## 2014-10-25 DIAGNOSIS — R350 Frequency of micturition: Secondary | ICD-10-CM

## 2014-10-25 LAB — POCT UA - MICROSCOPIC ONLY
Casts, Ur, LPF, POC: NEGATIVE
Crystals, Ur, HPF, POC: NEGATIVE
Mucus, UA: NEGATIVE
Yeast, UA: NEGATIVE

## 2014-10-25 LAB — POCT URINALYSIS DIPSTICK
Bilirubin, UA: NEGATIVE
Glucose, UA: NEGATIVE
KETONES UA: NEGATIVE
Nitrite, UA: POSITIVE
Protein, UA: NEGATIVE
Spec Grav, UA: <=1.005
Urobilinogen, UA: 0.2
pH, UA: 5.5

## 2014-10-25 MED ORDER — CIPROFLOXACIN HCL 250 MG PO TABS: 250.0000 mg | ORAL_TABLET | Freq: Two times a day (BID) | ORAL | Status: DC

## 2014-10-25 NOTE — Progress Notes (Signed)
   Subjective:    Patient ID: Leah Wu, female    DOB: [birth year], 44 y.o.   MRN: [id number]  HPI Patient with 3 days of dysuria, frequency. She increased her fluids and took AZO with some relief. She has had rare UTI in the past, always good relief with cipro. She was hiking all day on Saturday and does not think she had adequate liquids.    Review of Systems No fever/chills, no back pain, no hematuria, no nausea or vomiting, some lower abdominal pressure/pain. No vaginal discharge/itching/burning.     Objective:   Physical Exam  Constitutional: She is oriented to person, place, and time. She appears well-developed and well-nourished.  Eyes: Conjunctivae are normal.  Neck: Normal range of motion.  Cardiovascular: Normal rate, regular rhythm and normal heart sounds.   Pulmonary/Chest: Effort normal and breath sounds normal.  Abdominal: Soft. Bowel sounds are normal. She exhibits no distension and no mass. There is no tenderness. There is no rebound, no guarding and no CVA tenderness.  Musculoskeletal: Normal range of motion.  Neurological: She is alert and oriented to person, place, and time.  Skin: Skin is warm and dry.  Psychiatric: She has a normal mood and affect. Her behavior is normal. Judgment and thought content normal.  Vitals reviewed. BP 125/78 mmHg  Pulse 68  Temp(Src) 98.9 F (37.2 C) (Oral)  Resp 16  Ht 5' 4.5" (1.638 m)  Wt 136 lb (61.689 kg)  BMI 22.99 kg/m2  SpO2 95%  LMP 10/18/2014 Results for orders placed or performed in visit on 10/25/14  POCT urinalysis dipstick  Result Value Ref Range   Color, UA yellow    Clarity, UA cloudy    Glucose, UA neg    Bilirubin, UA neg    Ketones, UA neg    Spec Grav, UA <=1.005    Blood, UA small    pH, UA 5.5    Protein, UA neg    Urobilinogen, UA 0.2    Nitrite, UA positive    Leukocytes, UA small (1+)   POCT UA - Microscopic Only  Result Value Ref Range   WBC, Ur, HPF, POC 8-15    RBC, urine,  microscopic 6-10    Bacteria, U Microscopic 2+    Mucus, UA neg    Epithelial cells, urine per micros 2-4    Crystals, Ur, HPF, POC neg    Casts, Ur, LPF, POC neg    Yeast, UA neg       Assessment & Plan:  1. Urinary frequency - POCT urinalysis dipstick - POCT UA - Microscopic Only  2. Cystitis - Urine culture - Cipro 250 mg po BID x 3 days -provided written and verbal information regarding diagnosis and treatment - RTC if worsening symptoms, fever/chills, abdominal pain, nausea/vomiting, back pain  Elby Beck, FNP-BC  [facility] [place]  10/25/2014 9:17 PM

## 2014-10-25 NOTE — Patient Instructions (Signed)

## 2014-10-27 LAB — URINE CULTURE: Colony Count: 30000

## 2015-03-05 ENCOUNTER — Telehealth: Payer: Self-pay | Admitting: Internal Medicine

## 2015-03-05 NOTE — Telephone Encounter (Signed)
Seeing her 04/01/15  - records from [facility] urology setnt. HAd CT chest 02/08/15 RLL 6 mm nodule Will evaluate further during visit

## 2015-04-01 ENCOUNTER — Ambulatory Visit (INDEPENDENT_AMBULATORY_CARE_PROVIDER_SITE_OTHER): Payer: 59 | Admitting: Internal Medicine

## 2015-04-01 ENCOUNTER — Encounter (INDEPENDENT_AMBULATORY_CARE_PROVIDER_SITE_OTHER): Payer: Self-pay

## 2015-04-01 ENCOUNTER — Encounter: Payer: Self-pay | Admitting: Internal Medicine

## 2015-04-01 VITALS — BP 108/76 | HR 73 | Ht 64.5 in | Wt 143.0 lb

## 2015-04-01 DIAGNOSIS — R911 Solitary pulmonary nodule: Secondary | ICD-10-CM

## 2015-04-01 NOTE — Progress Notes (Signed)
Subjective:    Patient ID: Leah Wu, female    DOB: 15-Dec-1970, 44 y.o.   MRN: [id number] PCP Darlyn Chamber, MD  HPI   IOV 04/01/2015  Chief Complaint  Patient presents with  . Pulmonary Consult    Pt self referred for lung nodule on CT. Pt c/o dry cough. Pt denies CP/tightness and SOB.     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 Extremely physically fit with biking and other aerobic activities. She's been having mildly symptomatic microscopic hematuria since January 2016 but currently asymptomatic. Last diagnosed as persistent microcytic hematuria 1 month ago. She's been seeing urology for this. She had an abdominal CT scan which then resulted in a CT scan of the chest mid May 2016. I personally visualize this image. I confirmed a 6 mm small groundglass opacity in the right lower lobe which is subpleural in location [image 3 section 41]. It is so subtle. She's here for eval She denies any rest very complaints including shortness of breath, cough, wheezing, orthopnea, paroxysmal nocturnal dyspnea, hemoptysis, fever, chills, weight loss. She denies any other systemic complaints.  She has traveled to the [place] particularly for Epic training     has a past medical history of Lung nodule and Hematuria.   reports that she has never smoked. She has never used smokeless tobacco.  Past Surgical History  Procedure Laterality Date  . Lasix      Allergies  Allergen Reactions  . Sulfur     Immunization History  Administered Date(s) Administered  . Influenza-Unspecified 07/25/2014    Family History  Problem Relation Age of Onset  . Atrial fibrillation Mother   . Hypertension Father   . Hyperlipidemia Father   . Heart disease Father   . Cancer Sister   . Stroke Maternal Grandfather   . Diabetes Maternal Grandfather   . Cancer Paternal Grandmother   . Stroke Paternal  Grandfather      Current outpatient prescriptions:  .  esomeprazole (NEXIUM) 20 MG capsule, Take 20 mg by mouth daily as needed. , Disp: , Rfl:  .  norgestrel-ethinyl estradiol (LO/OVRAL,CRYSELLE) 0.3-30 MG-MCG tablet, Take 1 tablet by mouth daily., Disp: , Rfl:  .  ranitidine (ZANTAC) 150 MG tablet, Take 150 mg by mouth 2 (two) times daily as needed. , Disp: , Rfl:      Review of Systems  Constitutional: Negative for fever and unexpected weight change.  HENT: Negative for congestion, dental problem, ear pain, nosebleeds, postnasal drip, rhinorrhea, sinus pressure, sneezing, sore throat and trouble swallowing.   Eyes: Negative for redness and itching.  Respiratory: Positive for cough. Negative for chest tightness, shortness of breath and wheezing.   Cardiovascular: Negative for palpitations and leg swelling.  Gastrointestinal: Negative for nausea and vomiting.  Genitourinary: Negative for dysuria.  Musculoskeletal: Negative for joint swelling.  Skin: Negative for rash.  Neurological: Negative for headaches.  Hematological: Does not bruise/bleed easily.  Psychiatric/Behavioral: Negative for dysphoric mood. The patient is not nervous/anxious.        Objective:   Physical Exam  Constitutional: She is oriented to person, place, and time. She appears well-developed and well-nourished. No distress.  HENT:  Head: Normocephalic and atraumatic.  Right Ear: External ear normal.  Left Ear: External ear normal.  Mouth/Throat: Oropharynx is clear and moist. No oropharyngeal exudate.  Eyes: Conjunctivae and EOM are normal. Pupils are equal, round, and reactive to  light. Right eye exhibits no discharge. Left eye exhibits no discharge. No scleral icterus.  Neck: Normal range of motion. Neck supple. No JVD present. No tracheal deviation present. No thyromegaly present.  Cardiovascular: Normal rate, regular rhythm, normal heart sounds and intact distal pulses.  Exam reveals no gallop and no  friction rub.   No murmur heard. Pulmonary/Chest: Effort normal and breath sounds normal. No respiratory distress. She has no wheezes. She has no rales. She exhibits no tenderness.  Abdominal: Soft. Bowel sounds are normal. She exhibits no distension and no mass. There is no tenderness. There is no rebound and no guarding.  Musculoskeletal: Normal range of motion. She exhibits no edema or tenderness.  Lymphadenopathy:    She has no cervical adenopathy.  Neurological: She is alert and oriented to person, place, and time. She has normal reflexes. No cranial nerve deficit. She exhibits normal muscle tone. Coordination normal.  Skin: Skin is warm and dry. No rash noted. She is not diaphoretic. No erythema. No pallor.  Psychiatric: She has a normal mood and affect. Her behavior is normal. Judgment and thought content normal.  Vitals reviewed.   Filed Vitals:   04/01/15 1453  BP: 108/76  Pulse: 73  Height: 5' 4.5" (1.638 m)  Weight: 143 lb (64.864 kg)  SpO2: 98%  ;s      Assessment & Plan:     ICD-9-CM ICD-10-CM   1. Nodule of right lung 793.11 R91.1    At age 70, nonsmoker this vague right lower lobe 6 mm groundglass opacities extremely low probably ready for lung cancer.  Etiology for this is unknown but could be vascular compression.  We do not have any technologies at this point would diagnose what this is. Serial monitoring is best; total 2  Years or trill resolution whichever is earliest. . I will get a second opinion on the CT scan of the chest from our  local chest radiologist. Currently propose 9 month CT scan of the chest follow-up. She wants to avoid radiation exposure and prefers a longer interval for follow-up and i support this rationale. I will also propose  low-dose radiation methodology.    REpeat CT chest wo contrast in 9 months - will see if we can do it as low dose protocol Wil discuss your CT with our local chest radiologist as well; wil email your their  thoughts  Followup 9 months after CT chest (subject to change if radiology 2nd opinion feels differently)  Note: microscopic hematuria - if persists consider renal referral    Dr. Brand Males, M.D., [facility]C.P Pulmonary and Critical Care Medicine Staff Physician [place] [facility] Pager: [phone], If no answer or between  15:00h - 7:00h: call [phone]  04/01/2015 3:40 PM

## 2015-04-01 NOTE — Patient Instructions (Signed)
ICD-9-CM ICD-10-CM   1. Nodule of right lung 793.11 R91.1     REpeat CT chest wo contrast in 9 months - will see if we can do it as low dose protocol Wil discuss your CT with our local chest radiologist as well; wil email your their thoughts  Followup 9 months after CT chest (subject to change if radiology 2nd opinion feels differently)  Note: microscopic hematuria - if persists consider renal referral

## 2015-05-17 ENCOUNTER — Encounter: Payer: Self-pay | Admitting: Physician Assistant

## 2015-05-17 ENCOUNTER — Ambulatory Visit (INDEPENDENT_AMBULATORY_CARE_PROVIDER_SITE_OTHER): Payer: 59 | Admitting: Physician Assistant

## 2015-05-17 VITALS — BP 123/78 | HR 76 | Temp 99.5°F | Resp 16 | Ht 64.5 in | Wt 141.0 lb

## 2015-05-17 DIAGNOSIS — J019 Acute sinusitis, unspecified: Secondary | ICD-10-CM

## 2015-05-17 DIAGNOSIS — J029 Acute pharyngitis, unspecified: Secondary | ICD-10-CM | POA: Diagnosis not present

## 2015-05-17 LAB — POCT RAPID STREP A (OFFICE): Rapid Strep A Screen: NEGATIVE

## 2015-05-17 MED ORDER — HYDROCOD POLST-CPM POLST ER 10-8 MG/5ML PO SUER
5.0000 mL | Freq: Two times a day (BID) | ORAL | Status: DC | PRN
Start: 2015-05-17 — End: 2015-10-20

## 2015-05-17 MED ORDER — IPRATROPIUM BROMIDE 0.03 % NA SOLN: 2.0000 | Freq: Two times a day (BID) | NASAL | Status: DC

## 2015-05-17 MED ORDER — AMOXICILLIN-POT CLAVULANATE 875-125 MG PO TABS: 1.0000 | ORAL_TABLET | Freq: Two times a day (BID) | ORAL | Status: AC

## 2015-05-17 NOTE — Patient Instructions (Signed)
If prescribed mucinex, take twice a day with plenty of water (64 oz per day). If prescribed nasal spray, use twice a day. Hot showers, breathing in steam from shower or pot of boiling onions, eating spicy food and neti pot with sterile water can all help your sinuses drain. Take antibiotic twice a day for 10 days. Use cough syrup at night only. If you are not getting better in 7-10 days, return to clinic

## 2015-05-17 NOTE — Progress Notes (Signed)
[facility] [address] [place] [address] [phone]  Date:  05/17/2015   Name:  Leah Wu   DOB:  Dec 04, 1970   MRN:  [id number]  PCP:  Darlyn Chamber, MD    Chief Complaint: Sinus Problem; Fatigue; and Fever   History of Present Illness:  This is a 44 y.o. female with PMH depression who is presenting with sinus congestion, fatigue and fever x 5 days. States symptoms have been gradually worsening since onset. Sinus pressure predominant. States "feels like my head is going to pop". Pressure worse with bending forward. Upper teeth hurting.  Cough: yes, productive SOB/wheezing: no Nasal congestion: yes Otalgia: both, pressure Sore throat: mild Fever/chills: yes, low grade. 100 two days ago. 99.5 here today. Aggravating/alleviating factors: taking mucinex, sudafed, and cough syrup - not much help. History of asthma: no History of env allergies: no Tobacco use: no   Review of Systems:  Review of Systems See HPI  Patient Active Problem List   Diagnosis Date Noted  . Nodule of right lung 04/01/2015  . Depression 08/09/2014  . 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 Authorizing Provider  dextromethorphan 15 MG/5ML syrup Take 10 mLs by mouth 4 (four) times daily as needed for cough.   Yes Historical Provider, MD  dextromethorphan-guaiFENesin (MUCINEX DM) 30-600 MG per 12 hr tablet Take 1 tablet by mouth 2 (two) times daily.   Yes Historical Provider, MD  norgestrel-ethinyl estradiol (LO/OVRAL,CRYSELLE) 0.3-30 MG-MCG tablet Take 1 tablet by mouth daily.   Yes Historical Provider, MD    Allergies  Allergen Reactions  . Sulfur     Past Surgical History  Procedure Laterality Date  . Lasix      Social History  Substance Use Topics  . Smoking status: Never Smoker   . Smokeless tobacco: Never Used  . Alcohol Use: 0.0 oz/week    0 Standard drinks or equivalent per week      Comment: one drink per week    Family History  Problem Relation Age of Onset  . Atrial fibrillation Mother   . Hypertension Father   . Hyperlipidemia Father   . Heart disease Father   . Cancer Sister   . Stroke Maternal Grandfather   . Diabetes Maternal Grandfather   . Cancer Paternal Grandmother   . Stroke Paternal Grandfather     Medication list has been reviewed and updated.  Physical Examination:  Physical Exam  Constitutional: She is oriented to person, place, and time. She appears well-developed and well-nourished. No distress.  HENT:  Head: Normocephalic and atraumatic.  Right Ear: Hearing, tympanic membrane, external ear and ear canal normal.  Left Ear: Hearing, tympanic membrane, external ear and ear canal normal.  Nose: Right sinus exhibits maxillary sinus tenderness and frontal sinus tenderness. Left sinus exhibits maxillary sinus tenderness and frontal sinus tenderness.  Mouth/Throat: Uvula is midline and mucous membranes are normal. Oropharyngeal exudate and posterior oropharyngeal erythema present. No posterior oropharyngeal edema.  Beefy red tonsils, not enlarged, with satellite lesions on palate. Small exudate on left tonsil.  Eyes: Conjunctivae and lids are normal. Right eye exhibits no discharge. Left eye exhibits no discharge. No scleral icterus.  Cardiovascular: Normal rate, regular rhythm, normal heart sounds and normal pulses.   No murmur heard. Pulmonary/Chest: Effort normal and breath sounds normal. No respiratory distress. She has no wheezes. She has no rhonchi. She has no rales.  Musculoskeletal: Normal range of motion.  Lymphadenopathy:       Head (right side): No submental, no submandibular and no tonsillar adenopathy present.       Head (left side): No submental, no submandibular and no tonsillar adenopathy present.    She has no cervical adenopathy.  Neurological: She is alert and oriented to person, place, and time.  Skin: Skin is warm, dry  and intact. No lesion and no rash noted.  Psychiatric: She has a normal mood and affect. Her speech is normal and behavior is normal. Thought content normal.   BP 123/78 mmHg  Pulse 76  Temp(Src) 99.5 F (37.5 C)  Resp 16  Ht 5' 4.5" (1.638 m)  Wt 141 lb (63.957 kg)  BMI 23.84 kg/m2  Assessment and Plan:  1. Sore throat 2. Acute sinusitis Rapid strep negative, culture pending. Pt likely with acute sinusitis. Will treat with augmentin BID x 10 days. Other meds prescribed for symptom relief. Return if symptoms not improving in 7-10 days. - POCT rapid strep A - Culture, Group A Strep - ipratropium (ATROVENT) 0.03 % nasal spray; Place 2 sprays into both nostrils 2 (two) times daily.  Dispense: 30 mL; Refill: 0 - chlorpheniramine-HYDROcodone (TUSSIONEX PENNKINETIC ER) 10-8 MG/5ML SUER; Take 5 mLs by mouth every 12 (twelve) hours as needed for cough.  Dispense: 100 mL; Refill: 0 - amoxicillin-clavulanate (AUGMENTIN) 875-125 MG per tablet; Take 1 tablet by mouth 2 (two) times daily.  Dispense: 20 tablet; Refill: 0   Benjaman Pott. Drenda Freeze, MHS [facility]  05/17/2015

## 2015-05-19 LAB — CULTURE, GROUP A STREP: Organism ID, Bacteria: NORMAL

## 2015-05-24 ENCOUNTER — Telehealth: Payer: Self-pay

## 2015-05-24 MED ORDER — FLUCONAZOLE 150 MG PO TABS: 150.0000 mg | ORAL_TABLET | Freq: Once | ORAL | Status: DC

## 2015-05-24 NOTE — Telephone Encounter (Signed)
I have given her Diflucan but I am not sure an inhaler is appropriate for a sinus infection - she has no documented asthma in her problem list - for mucus production cough I would recommend mucinex for that problem

## 2015-05-24 NOTE — Telephone Encounter (Signed)
Left message for pt to call back  °

## 2015-05-24 NOTE — Telephone Encounter (Signed)
Spoke with pt, she would like a Diflucan sent in to her pharmacy. She was on Augmentin on 05/17/2015. She would also like a standard inhaler to take with her for her cough just in case since she will be on a trip. She states she is getting better but is still coughing up mucus. She just wants this just to have on hand, she is going hiking. Please advise.

## 2015-06-13 ENCOUNTER — Telehealth: Payer: Self-pay | Admitting: Internal Medicine

## 2015-06-13 DIAGNOSIS — R911 Solitary pulmonary nodule: Secondary | ICD-10-CM

## 2015-06-13 NOTE — Telephone Encounter (Signed)
Called and spoke to pt. Informed her of the results and recs per MR. Order placed. Pt verbalized understanding and denied any further questions or concerns at this time.   

## 2015-06-13 NOTE — Telephone Encounter (Signed)
Leah Wu has  a ground glass nodule > 33mm (hers is 19mm) and this was in may 2016 I believe. On new algorithm because of ground glass regardless of age and smoking hx, rec she needs  rpeat ct chest at  3 months and then once a year for 3 years.  The thinking is if resolved it will be resolved by 3 months and no more CT. Therefore, she needs a CT chest wo contrast sometime now.   Please order. Let her know wwe will call with results to decide next step  Thanks  Dr. Brand Males, M.D., [facility].C.P Pulmonary and Critical Care Medicine Staff Physician [place] [facility] Pager: [phone], If no answer or between  15:00h - 7:00h: call [phone]  06/13/2015 2:19 AM

## 2015-06-16 ENCOUNTER — Ambulatory Visit (INDEPENDENT_AMBULATORY_CARE_PROVIDER_SITE_OTHER)
Admission: RE | Admit: 2015-06-16 | Discharge: 2015-06-16 | Disposition: A | Discharge status: Home / Self Care | Payer: 59 | Source: Ambulatory Visit | Attending: Internal Medicine | Admitting: Internal Medicine

## 2015-06-16 DIAGNOSIS — R911 Solitary pulmonary nodule: Secondary | ICD-10-CM | POA: Diagnosis not present

## 2015-06-17 ENCOUNTER — Telehealth: Payer: Self-pay | Admitting: Internal Medicine

## 2015-06-17 DIAGNOSIS — R911 Solitary pulmonary nodule: Secondary | ICD-10-CM

## 2015-06-17 NOTE — Telephone Encounter (Signed)
Let JERALYN NOLDEN 09/14/1971 know that 78mm ground glass nodule is unchanged (still present without change) cmpared to 02/10/15. REC  - ct chest wo contrast - in 12 months. Need to do 1 a year for 3 year    - please order as low dose protocol for followup of nodule. This is protocol of low dose but not cancer screening  Fu  1 year   Ct Chest Wo Contrast  06/16/2015   CLINICAL DATA:  Follow-up lung nodule  EXAM: CT CHEST WITHOUT CONTRAST  TECHNIQUE: Multidetector CT imaging of the chest was performed following the standard protocol without IV contrast.  COMPARISON:  CT chest dated 02/10/2015  FINDINGS: Mediastinum/Nodes: The heart is normal in size. No pericardial effusion.  No suspicious mediastinal or axillary lymphadenopathy.  Visualized thyroid is unremarkable.  Lungs/Pleura: 6 x 5 mm subpleural ground-glass nodule in the posterior right lower lobe (series 3/ image 43), unchanged.  No new/ suspicious pulmonary nodules.  No focal consolidation.  No pleural effusion or pneumothorax.  Upper abdomen: Visualized upper abdomen is grossly unremarkable, noting a 5 mm probable cyst in the posterior right hepatic dome (series 2/ image 50).  Musculoskeletal: Visualized osseous structures are within normal limits.  IMPRESSION: 6 x 5 mm subpleural ground-glass nodule in the posterior right lower lobe, persistent.  Although this appearance is statistically likely benign, adenocarcinoma cannot be excluded. Follow up by CT is recommended in 12 months, with continued annual surveillance for a minimum of 3 years.  These recommendations are taken from:Recommendations for the Management of Subsolid Pulmonary Nodules Detected at CT: A Statement from the [facility] 2013; 266:1, [phone].   Electronically Signed   By: Julian Hy M.D.   On: 06/16/2015 14:27

## 2015-06-17 NOTE — Telephone Encounter (Signed)
Called and spoke to pt. Informed her of the results and recs per MR. CT order and 12 month recall placed. Pt verbalized understanding and denied any further questions or concerns at this time.

## 2015-07-27 ENCOUNTER — Other Ambulatory Visit: Payer: Self-pay | Admitting: Physician Assistant

## 2015-07-28 NOTE — Telephone Encounter (Signed)
Leah Ricks, do you want to give RFs?

## 2015-09-27 DIAGNOSIS — H1789 Other corneal scars and opacities: Secondary | ICD-10-CM | POA: Diagnosis not present

## 2015-09-27 DIAGNOSIS — D3142 Benign neoplasm of left ciliary body: Secondary | ICD-10-CM | POA: Diagnosis not present

## 2015-09-27 DIAGNOSIS — H5213 Myopia, bilateral: Secondary | ICD-10-CM | POA: Diagnosis not present

## 2015-10-10 DIAGNOSIS — M542 Cervicalgia: Secondary | ICD-10-CM | POA: Diagnosis not present

## 2015-10-20 ENCOUNTER — Ambulatory Visit (INDEPENDENT_AMBULATORY_CARE_PROVIDER_SITE_OTHER): Payer: 59 | Admitting: Podiatry

## 2015-10-20 ENCOUNTER — Encounter: Payer: Self-pay | Admitting: Podiatry

## 2015-10-20 VITALS — BP 131/82 | HR 88 | Resp 12

## 2015-10-20 DIAGNOSIS — L600 Ingrowing nail: Secondary | ICD-10-CM | POA: Diagnosis not present

## 2015-10-20 NOTE — Progress Notes (Signed)
She presents today concerns of an ingrown toenail to the hallux right. She states that the nail came off after she had been hiking in [place] and has since developed a new nail that has grown out approximately two thirds the length of the nail bed. She states that anytime she is active on it she feels pressure and pain as if the nail is being into the skin on the top of the toe.  Objective: Vital signs are stable she is alert and oriented 3 pulses are palpable right foot. There is no erythema no edema no cellulitis drainage or odor. Mild tenderness on palpation of the distal nail plate hallux right.  Assessment nail growing out normally hallux right.  Plan: Encouraged her to continue to keep the skin moisturized so that the nail ingrowing over the skin normally.

## 2015-10-24 DIAGNOSIS — M542 Cervicalgia: Secondary | ICD-10-CM | POA: Diagnosis not present

## 2015-10-25 ENCOUNTER — Ambulatory Visit: Payer: 59 | Admitting: Podiatry

## 2015-11-07 DIAGNOSIS — M542 Cervicalgia: Secondary | ICD-10-CM | POA: Diagnosis not present

## 2015-11-22 DIAGNOSIS — Z8041 Family history of malignant neoplasm of ovary: Secondary | ICD-10-CM | POA: Diagnosis not present

## 2015-11-22 DIAGNOSIS — Z13220 Encounter for screening for lipoid disorders: Secondary | ICD-10-CM | POA: Diagnosis not present

## 2015-11-22 DIAGNOSIS — Z1329 Encounter for screening for other suspected endocrine disorder: Secondary | ICD-10-CM | POA: Diagnosis not present

## 2015-11-22 DIAGNOSIS — Z113 Encounter for screening for infections with a predominantly sexual mode of transmission: Secondary | ICD-10-CM | POA: Diagnosis not present

## 2015-11-22 DIAGNOSIS — Z131 Encounter for screening for diabetes mellitus: Secondary | ICD-10-CM | POA: Diagnosis not present

## 2015-11-22 DIAGNOSIS — Z114 Encounter for screening for human immunodeficiency virus [HIV]: Secondary | ICD-10-CM | POA: Diagnosis not present

## 2015-11-22 DIAGNOSIS — Z1159 Encounter for screening for other viral diseases: Secondary | ICD-10-CM | POA: Diagnosis not present

## 2015-11-22 DIAGNOSIS — Z1321 Encounter for screening for nutritional disorder: Secondary | ICD-10-CM | POA: Diagnosis not present

## 2015-12-08 DIAGNOSIS — Z113 Encounter for screening for infections with a predominantly sexual mode of transmission: Secondary | ICD-10-CM | POA: Diagnosis not present

## 2015-12-08 DIAGNOSIS — Z8041 Family history of malignant neoplasm of ovary: Secondary | ICD-10-CM | POA: Diagnosis not present

## 2015-12-08 DIAGNOSIS — Z01419 Encounter for gynecological examination (general) (routine) without abnormal findings: Secondary | ICD-10-CM | POA: Diagnosis not present

## 2015-12-08 DIAGNOSIS — Z803 Family history of malignant neoplasm of breast: Secondary | ICD-10-CM | POA: Diagnosis not present

## 2015-12-08 DIAGNOSIS — Z1239 Encounter for other screening for malignant neoplasm of breast: Secondary | ICD-10-CM | POA: Diagnosis not present

## 2015-12-08 DIAGNOSIS — Z01411 Encounter for gynecological examination (general) (routine) with abnormal findings: Secondary | ICD-10-CM | POA: Diagnosis not present

## 2015-12-08 DIAGNOSIS — Z6824 Body mass index (BMI) 24.0-24.9, adult: Secondary | ICD-10-CM | POA: Diagnosis not present

## 2015-12-09 ENCOUNTER — Other Ambulatory Visit: Payer: Self-pay | Admitting: Obstetrics and Gynecology

## 2015-12-09 DIAGNOSIS — Z803 Family history of malignant neoplasm of breast: Secondary | ICD-10-CM

## 2015-12-12 ENCOUNTER — Encounter: Payer: Self-pay | Admitting: Genetic Counselor

## 2016-01-02 DIAGNOSIS — Z800 Family history of malignant neoplasm of digestive organs: Secondary | ICD-10-CM | POA: Diagnosis not present

## 2016-01-02 DIAGNOSIS — K625 Hemorrhage of anus and rectum: Secondary | ICD-10-CM | POA: Diagnosis not present

## 2016-01-02 DIAGNOSIS — K602 Anal fissure, unspecified: Secondary | ICD-10-CM | POA: Diagnosis not present

## 2016-01-10 ENCOUNTER — Other Ambulatory Visit: Payer: 59

## 2016-01-10 ENCOUNTER — Encounter: Payer: 59 | Admitting: Genetic Counselor

## 2016-01-30 DIAGNOSIS — Z1211 Encounter for screening for malignant neoplasm of colon: Secondary | ICD-10-CM | POA: Diagnosis not present

## 2016-01-30 DIAGNOSIS — K921 Melena: Secondary | ICD-10-CM | POA: Diagnosis not present

## 2016-01-30 DIAGNOSIS — K648 Other hemorrhoids: Secondary | ICD-10-CM | POA: Diagnosis not present

## 2016-01-30 DIAGNOSIS — Z800 Family history of malignant neoplasm of digestive organs: Secondary | ICD-10-CM | POA: Diagnosis not present

## 2016-01-30 DIAGNOSIS — K644 Residual hemorrhoidal skin tags: Secondary | ICD-10-CM | POA: Diagnosis not present

## 2016-02-01 ENCOUNTER — Ambulatory Visit
Admission: RE | Admit: 2016-02-01 | Discharge: 2016-02-01 | Disposition: A | Discharge status: Home / Self Care | Payer: 59 | Source: Ambulatory Visit | Attending: Obstetrics and Gynecology | Admitting: Obstetrics and Gynecology

## 2016-02-01 DIAGNOSIS — Z803 Family history of malignant neoplasm of breast: Secondary | ICD-10-CM | POA: Diagnosis not present

## 2016-02-01 MED ORDER — GADOBENATE DIMEGLUMINE 529 MG/ML IV SOLN
13.0000 mL | Freq: Once | INTRAVENOUS | Status: AC | PRN
  Administered 2016-02-01: 13 mL via INTRAVENOUS

## 2016-03-05 DIAGNOSIS — K640 First degree hemorrhoids: Secondary | ICD-10-CM | POA: Diagnosis not present

## 2016-03-05 DIAGNOSIS — Z800 Family history of malignant neoplasm of digestive organs: Secondary | ICD-10-CM | POA: Diagnosis not present

## 2016-05-09 DIAGNOSIS — B354 Tinea corporis: Secondary | ICD-10-CM | POA: Diagnosis not present

## 2016-05-14 DIAGNOSIS — D1801 Hemangioma of skin and subcutaneous tissue: Secondary | ICD-10-CM | POA: Diagnosis not present

## 2016-05-14 DIAGNOSIS — D225 Melanocytic nevi of trunk: Secondary | ICD-10-CM | POA: Diagnosis not present

## 2016-05-14 DIAGNOSIS — D2261 Melanocytic nevi of right upper limb, including shoulder: Secondary | ICD-10-CM | POA: Diagnosis not present

## 2016-05-14 DIAGNOSIS — D485 Neoplasm of uncertain behavior of skin: Secondary | ICD-10-CM | POA: Diagnosis not present

## 2016-05-14 DIAGNOSIS — D2262 Melanocytic nevi of left upper limb, including shoulder: Secondary | ICD-10-CM | POA: Diagnosis not present

## 2016-05-14 DIAGNOSIS — B354 Tinea corporis: Secondary | ICD-10-CM | POA: Diagnosis not present

## 2016-06-18 ENCOUNTER — Telehealth: Payer: Self-pay | Admitting: Internal Medicine

## 2016-06-18 ENCOUNTER — Ambulatory Visit (INDEPENDENT_AMBULATORY_CARE_PROVIDER_SITE_OTHER)
Admission: RE | Admit: 2016-06-18 | Discharge: 2016-06-18 | Disposition: A | Discharge status: Home / Self Care | Payer: 59 | Source: Ambulatory Visit | Attending: Internal Medicine | Admitting: Internal Medicine

## 2016-06-18 ENCOUNTER — Encounter (INDEPENDENT_AMBULATORY_CARE_PROVIDER_SITE_OTHER): Payer: Self-pay

## 2016-06-18 DIAGNOSIS — R911 Solitary pulmonary nodule: Secondary | ICD-10-CM

## 2016-06-18 DIAGNOSIS — R918 Other nonspecific abnormal finding of lung field: Secondary | ICD-10-CM | POA: Diagnosis not present

## 2016-06-18 NOTE — Telephone Encounter (Signed)
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 Please let her know CT ggo nodule is stable. Do fu CT in 1-2 years. So I pick 18 months - please do it for fu nodule - ct chest wo contrast   Ct Chest Wo Contrast  Result Date: 06/18/2016 CLINICAL DATA:  Followup of right-sided pulmonary nodules EXAM: CT CHEST WITHOUT CONTRAST TECHNIQUE: Multidetector CT imaging of the chest was performed following the standard protocol without IV contrast. COMPARISON:  06/16/2015 FINDINGS: Cardiovascular: Normal heart size, without pericardial effusion. Mediastinum/Nodes: No mediastinal or definite hilar adenopathy, given limitations of unenhanced CT. Minimal residual thymic tissue in the anterior mediastinum. Lungs/Pleura: No pleural fluid. Right upper lobe ground-glass nodule measures 5 mm on image 49/series 3 and is unchanged (when remeasured). Right lower lobe subpleural ground-glass nodule measures 5 x 5 mm on image 110/series 3. Similar to 5 x 6 mm on the prior exam. Clear left lung. Upper Abdomen: Scattered hypo attenuating liver lesions are small and similar, likely cysts. Normal imaged portions of the spleen, stomach, pancreas, adrenal glands, kidneys. Musculoskeletal: Bone island in the left humeral head. No acute osseous abnormality. IMPRESSION: No change in right-sided ground-glass nodules which measure up to 5 mm. Per most recent consensus criteria, the should be considered for follow-up chest CT at 1-2 years. This recommendation follows the consensus statement: Guidelines for Management of Incidental Pulmonary Nodules Detected on CT Images:From the Fleischner Society 2017; published online before print (10.1148/[id number]). Electronically Signed   By: Abigail Miyamoto M.D.   On: 06/18/2016 15:52

## 2016-06-20 NOTE — Telephone Encounter (Signed)
lmtcb for pt.  

## 2016-06-25 NOTE — Telephone Encounter (Signed)
Called and spoke to pt. Informed her of the results and recs per MR. Order placed. Pt verbalized understanding and denied any further questions or concerns at this time.   

## 2016-07-19 ENCOUNTER — Ambulatory Visit: Payer: 59 | Admitting: Internal Medicine

## 2016-08-28 MED FILL — TERBINAFINE HCL 250 MG TAB: 250 | 10 days supply | Qty: 10 | Fill #0

## 2016-09-10 ENCOUNTER — Telehealth: Payer: 59 | Admitting: Family

## 2016-09-10 DIAGNOSIS — J019 Acute sinusitis, unspecified: Secondary | ICD-10-CM | POA: Diagnosis not present

## 2016-09-10 MED ORDER — AMOXICILLIN-POT CLAVULANATE 875-125 MG PO TABS: 1.0000 | ORAL_TABLET | Freq: Two times a day (BID) | ORAL | 0 refills | Status: AC

## 2016-09-10 NOTE — Progress Notes (Signed)

## 2016-09-28 MED FILL — LOTEMAX 0.5% EYE DROPS: 0.5 | 12 days supply | Qty: 5 | Fill #0

## 2016-10-09 MED FILL — CRYSELLE-28 TABLET: 0.3-30 | 84 days supply | Qty: 84 | Fill #0

## 2016-10-15 DIAGNOSIS — H04123 Dry eye syndrome of bilateral lacrimal glands: Secondary | ICD-10-CM | POA: Diagnosis not present

## 2016-10-16 ENCOUNTER — Telehealth: Payer: 59 | Admitting: Family

## 2016-10-16 DIAGNOSIS — N3000 Acute cystitis without hematuria: Secondary | ICD-10-CM

## 2016-10-16 MED ORDER — CIPROFLOXACIN HCL 500 MG PO TABS: 500.0000 mg | ORAL_TABLET | Freq: Two times a day (BID) | ORAL | 0 refills | Status: AC

## 2016-10-16 NOTE — Progress Notes (Signed)

## 2016-10-17 MED FILL — CIPROFLOXACIN HCL 500 MG TA: 500 | 5 days supply | Qty: 10 | Fill #0

## 2016-12-17 DIAGNOSIS — Z6823 Body mass index (BMI) 23.0-23.9, adult: Secondary | ICD-10-CM | POA: Diagnosis not present

## 2016-12-17 DIAGNOSIS — Z01419 Encounter for gynecological examination (general) (routine) without abnormal findings: Secondary | ICD-10-CM | POA: Diagnosis not present

## 2016-12-17 DIAGNOSIS — Z113 Encounter for screening for infections with a predominantly sexual mode of transmission: Secondary | ICD-10-CM | POA: Diagnosis not present

## 2016-12-19 MED FILL — CRYSELLE-28 TABLET: 0.3-30 | 63 days supply | Qty: 84 | Fill #1

## 2017-01-21 DIAGNOSIS — H5213 Myopia, bilateral: Secondary | ICD-10-CM | POA: Diagnosis not present

## 2017-01-21 DIAGNOSIS — H04121 Dry eye syndrome of right lacrimal gland: Secondary | ICD-10-CM | POA: Diagnosis not present

## 2017-02-19 MED FILL — CRYSELLE-28 TABLET: 0.3-30 | 84 days supply | Qty: 84 | Fill #0

## 2017-04-02 DIAGNOSIS — L02224 Furuncle of groin: Secondary | ICD-10-CM | POA: Diagnosis not present

## 2017-04-02 MED FILL — MUPIROCIN 2% OINTMENT: 2 | 10 days supply | Qty: 22 | Fill #0

## 2017-04-26 MED FILL — CRYSELLE-28 TABLET: 0.3-30 | 63 days supply | Qty: 84 | Fill #1

## 2017-07-09 MED FILL — CRYSELLE-28 TABLET: 0.3-30 | 63 days supply | Qty: 84 | Fill #2

## 2017-07-15 DIAGNOSIS — D485 Neoplasm of uncertain behavior of skin: Secondary | ICD-10-CM | POA: Diagnosis not present

## 2017-07-15 DIAGNOSIS — D225 Melanocytic nevi of trunk: Secondary | ICD-10-CM | POA: Diagnosis not present

## 2017-07-15 DIAGNOSIS — D2262 Melanocytic nevi of left upper limb, including shoulder: Secondary | ICD-10-CM | POA: Diagnosis not present

## 2017-07-15 DIAGNOSIS — L298 Other pruritus: Secondary | ICD-10-CM | POA: Diagnosis not present

## 2017-07-15 DIAGNOSIS — D2271 Melanocytic nevi of right lower limb, including hip: Secondary | ICD-10-CM | POA: Diagnosis not present

## 2017-07-23 DIAGNOSIS — L988 Other specified disorders of the skin and subcutaneous tissue: Secondary | ICD-10-CM | POA: Diagnosis not present

## 2017-07-23 DIAGNOSIS — D485 Neoplasm of uncertain behavior of skin: Secondary | ICD-10-CM | POA: Diagnosis not present

## 2017-08-12 DIAGNOSIS — M26601 Right temporomandibular joint disorder, unspecified: Secondary | ICD-10-CM | POA: Diagnosis not present

## 2017-08-12 DIAGNOSIS — M50322 Other cervical disc degeneration at C5-C6 level: Secondary | ICD-10-CM | POA: Diagnosis not present

## 2017-08-12 DIAGNOSIS — M9901 Segmental and somatic dysfunction of cervical region: Secondary | ICD-10-CM | POA: Diagnosis not present

## 2017-08-12 DIAGNOSIS — M9902 Segmental and somatic dysfunction of thoracic region: Secondary | ICD-10-CM | POA: Diagnosis not present

## 2017-08-12 MED FILL — CYCLOBENZAPRINE 10 MG TAB: 10 | 14 days supply | Qty: 14 | Fill #0

## 2017-08-14 DIAGNOSIS — M9902 Segmental and somatic dysfunction of thoracic region: Secondary | ICD-10-CM | POA: Diagnosis not present

## 2017-08-14 DIAGNOSIS — M26601 Right temporomandibular joint disorder, unspecified: Secondary | ICD-10-CM | POA: Diagnosis not present

## 2017-08-14 DIAGNOSIS — M50322 Other cervical disc degeneration at C5-C6 level: Secondary | ICD-10-CM | POA: Diagnosis not present

## 2017-08-14 DIAGNOSIS — M9901 Segmental and somatic dysfunction of cervical region: Secondary | ICD-10-CM | POA: Diagnosis not present

## 2017-08-22 MED FILL — diazePAM 2 MG TABS: 2 | 10 days supply | Qty: 10 | Fill #0

## 2017-09-18 MED FILL — ALPRAZolam 1 MG TABS: 1 | 30 days supply | Qty: 30 | Fill #0

## 2017-09-26 MED FILL — CRYSELLE-28 TABLET: 0.3-30 | 63 days supply | Qty: 84 | Fill #3

## 2017-10-07 DIAGNOSIS — M542 Cervicalgia: Secondary | ICD-10-CM | POA: Diagnosis not present

## 2017-10-07 DIAGNOSIS — M25512 Pain in left shoulder: Secondary | ICD-10-CM | POA: Diagnosis not present

## 2017-10-07 DIAGNOSIS — M25511 Pain in right shoulder: Secondary | ICD-10-CM | POA: Diagnosis not present

## 2017-10-07 MED FILL — METHYLPREDNISOLONE 4 MG TAB: 4 | 6 days supply | Qty: 21 | Fill #0

## 2017-10-10 ENCOUNTER — Other Ambulatory Visit: Payer: Self-pay | Admitting: Obstetrics and Gynecology

## 2017-10-10 DIAGNOSIS — Z1231 Encounter for screening mammogram for malignant neoplasm of breast: Secondary | ICD-10-CM

## 2017-10-11 DIAGNOSIS — M542 Cervicalgia: Secondary | ICD-10-CM | POA: Diagnosis not present

## 2017-10-12 DIAGNOSIS — M542 Cervicalgia: Secondary | ICD-10-CM | POA: Diagnosis not present

## 2017-10-15 DIAGNOSIS — M542 Cervicalgia: Secondary | ICD-10-CM | POA: Diagnosis not present

## 2017-10-19 DIAGNOSIS — M542 Cervicalgia: Secondary | ICD-10-CM | POA: Diagnosis not present

## 2017-10-23 DIAGNOSIS — M542 Cervicalgia: Secondary | ICD-10-CM | POA: Diagnosis not present

## 2017-10-24 DIAGNOSIS — M47812 Spondylosis without myelopathy or radiculopathy, cervical region: Secondary | ICD-10-CM | POA: Diagnosis not present

## 2017-10-30 ENCOUNTER — Ambulatory Visit
Admission: RE | Admit: 2017-10-30 | Discharge: 2017-10-30 | Disposition: A | Discharge status: Home / Self Care | Payer: 59 | Source: Ambulatory Visit | Attending: Obstetrics and Gynecology | Admitting: Obstetrics and Gynecology

## 2017-10-30 DIAGNOSIS — Z1231 Encounter for screening mammogram for malignant neoplasm of breast: Secondary | ICD-10-CM | POA: Diagnosis not present

## 2017-10-31 DIAGNOSIS — M542 Cervicalgia: Secondary | ICD-10-CM | POA: Diagnosis not present

## 2017-11-27 ENCOUNTER — Other Ambulatory Visit: Payer: Self-pay | Admitting: *Deleted

## 2017-11-27 DIAGNOSIS — R911 Solitary pulmonary nodule: Secondary | ICD-10-CM

## 2017-11-27 NOTE — Progress Notes (Unsigned)
Pt needs to have a CT scan 18 months from previous scan that was done 06/18/16.  Previous order that was placed for CT to be done has been expired.  Placed new order for pt to have CT without contrast to follow up on lung nodule.  Pt needs to come in for an OV after CT scan.  Will await pt appt to be made before encounter is closed.

## 2017-12-02 MED FILL — CRYSELLE-28 TABLET: 0.3-30 | 63 days supply | Qty: 84 | Fill #4

## 2017-12-03 MED FILL — ALPRAZolam 1 MG TABS: 1 | 30 days supply | Qty: 30 | Fill #0

## 2017-12-24 ENCOUNTER — Ambulatory Visit (INDEPENDENT_AMBULATORY_CARE_PROVIDER_SITE_OTHER)
Admission: RE | Admit: 2017-12-24 | Discharge: 2017-12-24 | Disposition: A | Discharge status: Home / Self Care | Payer: 59 | Source: Ambulatory Visit | Attending: Internal Medicine | Admitting: Internal Medicine

## 2017-12-24 DIAGNOSIS — R911 Solitary pulmonary nodule: Secondary | ICD-10-CM

## 2017-12-26 ENCOUNTER — Encounter: Payer: Self-pay | Admitting: Internal Medicine

## 2017-12-26 DIAGNOSIS — R911 Solitary pulmonary nodule: Secondary | ICD-10-CM

## 2017-12-26 NOTE — Telephone Encounter (Signed)
Spoke with pt regarding her CT and stated to her we could cancel her OV.  Pt expressed understanding.  Stated to pt we would have her repeat CT scan in 2 years and then have a ROV 2 years after CT scan.  Order placed for CT scan. Nothing further needed at this time.

## 2017-12-26 NOTE — Telephone Encounter (Signed)
RESULTS IMPRESSION: 1. Stable 5 mm ground-glass nodules in the right upper and lower lobes. Follow-up by CT is recommended in 2 years and again at 4 years. These recommendations are taken from: Recommendations for the Management of Subsolid Pulmonary Nodules Detected at CT: A Statement from the [facility] 2013; 266:1, [phone].   Electronically Signed   By: Misty Stanley M.D.   On: 12/25/2017 09:58   PLAN  - can cancel ov 12/27/17 - do ct chest without contrast - prefer low dose protocl if they can do that in 2 years and ROV in 2 years   Ct Chest Wo Contrast  Result Date: 12/25/2017 CLINICAL DATA:  Right upper lobe lung nodule. EXAM: CT CHEST WITHOUT CONTRAST TECHNIQUE: Multidetector CT imaging of the chest was performed following the standard protocol without IV contrast. COMPARISON:  06/18/2016.  02/10/2015. FINDINGS: Cardiovascular: The heart size is normal.  No pericardial effusion. Mediastinum/Nodes: No mediastinal lymphadenopathy. No evidence for gross hilar lymphadenopathy although assessment is limited by the lack of intravenous contrast on today's study. The esophagus has normal imaging features. There is no axillary lymphadenopathy. Lungs/Pleura: 5 mm right upper lobe remains stable at 5 mm (image 53/series 3). Posterior right lower lobe 5 mm ground-glass nodule is also unchanged (108/3). 4 mm left apical nodule is stable. No focal airspace consolidation. No pulmonary edema or pleural effusion. Upper Abdomen: Small hypodensities in the dome of the left liver and lateral right liver are unchanged in the interval and compatible with benign etiology, likely cysts. Musculoskeletal: Bone windows reveal no worrisome lytic or sclerotic osseous lesions. IMPRESSION: 1. Stable 5 mm ground-glass nodules in the right upper and lower lobes. Follow-up by CT is recommended in 2 years and again at 4 years. These recommendations are taken from: Recommendations for the Management of  Subsolid Pulmonary Nodules Detected at CT: A Statement from the [facility] 2013; 266:1, [phone]. Electronically Signed   By: Misty Stanley M.D.   On: 12/25/2017 09:58

## 2017-12-27 ENCOUNTER — Ambulatory Visit: Payer: 59 | Admitting: Internal Medicine

## 2017-12-28 DIAGNOSIS — J984 Other disorders of lung: Secondary | ICD-10-CM | POA: Diagnosis not present

## 2017-12-28 DIAGNOSIS — M545 Low back pain: Secondary | ICD-10-CM | POA: Diagnosis not present

## 2017-12-28 DIAGNOSIS — I6502 Occlusion and stenosis of left vertebral artery: Secondary | ICD-10-CM | POA: Diagnosis not present

## 2017-12-28 DIAGNOSIS — S069X0A Unspecified intracranial injury without loss of consciousness, initial encounter: Secondary | ICD-10-CM | POA: Diagnosis not present

## 2017-12-28 DIAGNOSIS — S15001A Unspecified injury of right carotid artery, initial encounter: Secondary | ICD-10-CM | POA: Diagnosis not present

## 2017-12-28 DIAGNOSIS — Y999 Unspecified external cause status: Secondary | ICD-10-CM | POA: Diagnosis not present

## 2017-12-28 DIAGNOSIS — F419 Anxiety disorder, unspecified: Secondary | ICD-10-CM | POA: Diagnosis not present

## 2017-12-28 DIAGNOSIS — S15001D Unspecified injury of right carotid artery, subsequent encounter: Secondary | ICD-10-CM | POA: Diagnosis not present

## 2017-12-28 DIAGNOSIS — I6522 Occlusion and stenosis of left carotid artery: Secondary | ICD-10-CM | POA: Diagnosis not present

## 2017-12-28 DIAGNOSIS — S06819A Injury of right internal carotid artery, intracranial portion, not elsewhere classified with loss of consciousness of unspecified duration, initial encounter: Secondary | ICD-10-CM | POA: Diagnosis not present

## 2017-12-28 DIAGNOSIS — I7774 Dissection of vertebral artery: Secondary | ICD-10-CM | POA: Diagnosis not present

## 2017-12-28 DIAGNOSIS — I7771 Dissection of carotid artery: Secondary | ICD-10-CM | POA: Diagnosis not present

## 2017-12-28 DIAGNOSIS — G4489 Other headache syndrome: Secondary | ICD-10-CM | POA: Diagnosis not present

## 2017-12-28 DIAGNOSIS — S15102A Unspecified injury of left vertebral artery, initial encounter: Secondary | ICD-10-CM | POA: Diagnosis not present

## 2017-12-28 DIAGNOSIS — R51 Headache: Secondary | ICD-10-CM | POA: Diagnosis not present

## 2017-12-28 DIAGNOSIS — Z3202 Encounter for pregnancy test, result negative: Secondary | ICD-10-CM | POA: Diagnosis not present

## 2017-12-28 DIAGNOSIS — G8911 Acute pain due to trauma: Secondary | ICD-10-CM | POA: Diagnosis not present

## 2017-12-28 DIAGNOSIS — S2242XA Multiple fractures of ribs, left side, initial encounter for closed fracture: Secondary | ICD-10-CM | POA: Diagnosis not present

## 2017-12-28 DIAGNOSIS — S066X9A Traumatic subarachnoid hemorrhage with loss of consciousness of unspecified duration, initial encounter: Secondary | ICD-10-CM | POA: Diagnosis not present

## 2017-12-28 DIAGNOSIS — S2232XA Fracture of one rib, left side, initial encounter for closed fracture: Secondary | ICD-10-CM | POA: Diagnosis not present

## 2017-12-28 DIAGNOSIS — M4186 Other forms of scoliosis, lumbar region: Secondary | ICD-10-CM | POA: Diagnosis not present

## 2017-12-28 DIAGNOSIS — S42022D Displaced fracture of shaft of left clavicle, subsequent encounter for fracture with routine healing: Secondary | ICD-10-CM | POA: Diagnosis not present

## 2017-12-28 DIAGNOSIS — E878 Other disorders of electrolyte and fluid balance, not elsewhere classified: Secondary | ICD-10-CM | POA: Diagnosis not present

## 2017-12-28 DIAGNOSIS — S066X0A Traumatic subarachnoid hemorrhage without loss of consciousness, initial encounter: Secondary | ICD-10-CM | POA: Diagnosis not present

## 2017-12-28 DIAGNOSIS — R0689 Other abnormalities of breathing: Secondary | ICD-10-CM | POA: Diagnosis not present

## 2017-12-28 DIAGNOSIS — Z79899 Other long term (current) drug therapy: Secondary | ICD-10-CM | POA: Diagnosis not present

## 2017-12-28 DIAGNOSIS — M542 Cervicalgia: Secondary | ICD-10-CM | POA: Diagnosis not present

## 2017-12-28 DIAGNOSIS — R109 Unspecified abdominal pain: Secondary | ICD-10-CM | POA: Diagnosis not present

## 2017-12-28 DIAGNOSIS — M50222 Other cervical disc displacement at C5-C6 level: Secondary | ICD-10-CM | POA: Diagnosis not present

## 2017-12-28 DIAGNOSIS — M50223 Other cervical disc displacement at C6-C7 level: Secondary | ICD-10-CM | POA: Diagnosis not present

## 2017-12-28 DIAGNOSIS — Z7982 Long term (current) use of aspirin: Secondary | ICD-10-CM | POA: Diagnosis not present

## 2017-12-28 DIAGNOSIS — S270XXA Traumatic pneumothorax, initial encounter: Secondary | ICD-10-CM | POA: Diagnosis not present

## 2017-12-28 DIAGNOSIS — Y92410 Unspecified street and highway as the place of occurrence of the external cause: Secondary | ICD-10-CM | POA: Diagnosis not present

## 2017-12-28 DIAGNOSIS — S42022A Displaced fracture of shaft of left clavicle, initial encounter for closed fracture: Secondary | ICD-10-CM | POA: Diagnosis not present

## 2017-12-28 DIAGNOSIS — S098XXA Other specified injuries of head, initial encounter: Secondary | ICD-10-CM | POA: Diagnosis not present

## 2018-01-02 ENCOUNTER — Other Ambulatory Visit: Payer: Self-pay | Admitting: *Deleted

## 2018-01-02 MED FILL — ONDANSETRON HCL 4 MG TABLET: 4 | 6 days supply | Qty: 20 | Fill #0
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
## 2018-01-14 ENCOUNTER — Ambulatory Visit (HOSPITAL_COMMUNITY)
Admission: RE | Admit: 2018-01-14 | Discharge: 2018-01-14 | Disposition: A | Discharge status: Home / Self Care | Payer: 59 | Source: Ambulatory Visit | Attending: Cardiovascular Disease | Admitting: Cardiovascular Disease

## 2018-01-14 ENCOUNTER — Other Ambulatory Visit: Payer: Self-pay | Admitting: Cardiovascular Disease

## 2018-01-14 DIAGNOSIS — I6522 Occlusion and stenosis of left carotid artery: Secondary | ICD-10-CM | POA: Insufficient documentation

## 2018-01-14 DIAGNOSIS — I7774 Dissection of vertebral artery: Secondary | ICD-10-CM | POA: Insufficient documentation

## 2018-01-14 DIAGNOSIS — I7771 Dissection of carotid artery: Secondary | ICD-10-CM | POA: Diagnosis not present

## 2018-01-14 DIAGNOSIS — M25552 Pain in left hip: Secondary | ICD-10-CM | POA: Diagnosis not present

## 2018-01-16 DIAGNOSIS — S2249XD Multiple fractures of ribs, unspecified side, subsequent encounter for fracture with routine healing: Secondary | ICD-10-CM | POA: Diagnosis not present

## 2018-01-16 DIAGNOSIS — S066X9D Traumatic subarachnoid hemorrhage with loss of consciousness of unspecified duration, subsequent encounter: Secondary | ICD-10-CM | POA: Diagnosis not present

## 2018-01-16 DIAGNOSIS — S270XXD Traumatic pneumothorax, subsequent encounter: Secondary | ICD-10-CM | POA: Diagnosis not present

## 2018-01-22 DIAGNOSIS — I7771 Dissection of carotid artery: Secondary | ICD-10-CM | POA: Diagnosis not present

## 2018-01-22 DIAGNOSIS — I609 Nontraumatic subarachnoid hemorrhage, unspecified: Secondary | ICD-10-CM | POA: Diagnosis not present

## 2018-01-23 MED FILL — XARELTO STARTER PACK: 15 & 20 | 30 days supply | Qty: 51 | Fill #0

## 2018-01-28 DIAGNOSIS — M25552 Pain in left hip: Secondary | ICD-10-CM | POA: Diagnosis not present

## 2018-01-29 ENCOUNTER — Encounter: Payer: 59 | Admitting: Surgery

## 2018-01-29 ENCOUNTER — Encounter: Payer: 59 | Admitting: Vascular Surgery

## 2018-01-29 ENCOUNTER — Telehealth: Payer: Self-pay | Admitting: Surgery

## 2018-01-29 DIAGNOSIS — S42022D Displaced fracture of shaft of left clavicle, subsequent encounter for fracture with routine healing: Secondary | ICD-10-CM | POA: Diagnosis not present
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
## 2018-01-30 ENCOUNTER — Other Ambulatory Visit: Payer: Self-pay | Admitting: Orthopedic Surgery

## 2018-01-30 DIAGNOSIS — M25552 Pain in left hip: Secondary | ICD-10-CM

## 2018-02-02 ENCOUNTER — Ambulatory Visit
Admission: RE | Admit: 2018-02-02 | Discharge: 2018-02-02 | Disposition: A | Discharge status: Home / Self Care | Payer: 59 | Source: Ambulatory Visit | Attending: Orthopedic Surgery | Admitting: Orthopedic Surgery

## 2018-02-02 DIAGNOSIS — M25552 Pain in left hip: Secondary | ICD-10-CM

## 2018-02-02 DIAGNOSIS — S79912A Unspecified injury of left hip, initial encounter: Secondary | ICD-10-CM | POA: Diagnosis not present

## 2018-02-02 DIAGNOSIS — S79911A Unspecified injury of right hip, initial encounter: Secondary | ICD-10-CM | POA: Diagnosis not present

## 2018-02-03 DIAGNOSIS — Z01419 Encounter for gynecological examination (general) (routine) without abnormal findings: Secondary | ICD-10-CM | POA: Diagnosis not present

## 2018-02-03 DIAGNOSIS — Z1321 Encounter for screening for nutritional disorder: Secondary | ICD-10-CM | POA: Diagnosis not present

## 2018-02-03 DIAGNOSIS — Z1329 Encounter for screening for other suspected endocrine disorder: Secondary | ICD-10-CM | POA: Diagnosis not present

## 2018-02-03 DIAGNOSIS — Z800 Family history of malignant neoplasm of digestive organs: Secondary | ICD-10-CM | POA: Diagnosis not present

## 2018-02-03 DIAGNOSIS — Z131 Encounter for screening for diabetes mellitus: Secondary | ICD-10-CM | POA: Diagnosis not present

## 2018-02-03 DIAGNOSIS — Z6822 Body mass index (BMI) 22.0-22.9, adult: Secondary | ICD-10-CM | POA: Diagnosis not present

## 2018-02-03 DIAGNOSIS — Z13220 Encounter for screening for lipoid disorders: Secondary | ICD-10-CM | POA: Diagnosis not present

## 2018-02-03 DIAGNOSIS — R319 Hematuria, unspecified: Secondary | ICD-10-CM | POA: Diagnosis not present

## 2018-02-03 DIAGNOSIS — Z803 Family history of malignant neoplasm of breast: Secondary | ICD-10-CM | POA: Diagnosis not present

## 2018-02-03 DIAGNOSIS — Z8041 Family history of malignant neoplasm of ovary: Secondary | ICD-10-CM | POA: Diagnosis not present

## 2018-02-05 ENCOUNTER — Other Ambulatory Visit (HOSPITAL_COMMUNITY): Payer: Self-pay | Admitting: Obstetrics and Gynecology

## 2018-02-05 DIAGNOSIS — Z803 Family history of malignant neoplasm of breast: Secondary | ICD-10-CM

## 2018-02-05 MED FILL — ALPRAZolam 0.5 MG TABS: 0.5 | 30 days supply | Qty: 30 | Fill #0

## 2018-02-06 DIAGNOSIS — S42022D Displaced fracture of shaft of left clavicle, subsequent encounter for fracture with routine healing: Secondary | ICD-10-CM | POA: Diagnosis not present

## 2018-02-07 DIAGNOSIS — M25512 Pain in left shoulder: Secondary | ICD-10-CM | POA: Diagnosis not present

## 2018-02-07 DIAGNOSIS — M25552 Pain in left hip: Secondary | ICD-10-CM | POA: Diagnosis not present

## 2018-02-11 ENCOUNTER — Ambulatory Visit (HOSPITAL_COMMUNITY): Payer: 59

## 2018-02-12 DIAGNOSIS — Z7982 Long term (current) use of aspirin: Secondary | ICD-10-CM | POA: Diagnosis not present

## 2018-02-12 DIAGNOSIS — Z7901 Long term (current) use of anticoagulants: Secondary | ICD-10-CM | POA: Diagnosis not present

## 2018-02-12 DIAGNOSIS — I7771 Dissection of carotid artery: Secondary | ICD-10-CM | POA: Diagnosis not present

## 2018-02-12 DIAGNOSIS — I7774 Dissection of vertebral artery: Secondary | ICD-10-CM | POA: Diagnosis not present

## 2018-02-13 DIAGNOSIS — R030 Elevated blood-pressure reading, without diagnosis of hypertension: Secondary | ICD-10-CM | POA: Diagnosis not present

## 2018-02-13 DIAGNOSIS — I7771 Dissection of carotid artery: Secondary | ICD-10-CM | POA: Diagnosis not present

## 2018-02-13 MED FILL — XARELTO 20 MG TABLET: 20 | 30 days supply | Qty: 30 | Fill #0

## 2018-02-18 ENCOUNTER — Ambulatory Visit (HOSPITAL_COMMUNITY)
Admission: RE | Admit: 2018-02-18 | Discharge: 2018-02-18 | Disposition: A | Discharge status: Home / Self Care | Payer: 59 | Source: Ambulatory Visit | Attending: Obstetrics and Gynecology | Admitting: Obstetrics and Gynecology

## 2018-02-18 DIAGNOSIS — Z803 Family history of malignant neoplasm of breast: Secondary | ICD-10-CM | POA: Diagnosis not present

## 2018-02-18 MED ORDER — GADOBENATE DIMEGLUMINE 529 MG/ML IV SOLN
15.0000 mL | Freq: Once | INTRAVENOUS | Status: AC | PRN
  Administered 2018-02-18: 13 mL via INTRAVENOUS

## 2018-02-19 DIAGNOSIS — I7774 Dissection of vertebral artery: Secondary | ICD-10-CM | POA: Diagnosis not present

## 2018-02-24 MED FILL — CLOPIDOGREL 75 MG TABLET: 75 | 90 days supply | Qty: 90 | Fill #0

## 2018-03-03 MED FILL — CRYSELLE-28 TABLET: 0.3-30 | 84 days supply | Qty: 84 | Fill #0

## 2018-03-04 ENCOUNTER — Encounter (HOSPITAL_COMMUNITY): Payer: 59

## 2018-03-07 MED FILL — ALPRAZolam 0.5 MG TABS: 0.5 | 30 days supply | Qty: 30 | Fill #1

## 2018-03-11 DIAGNOSIS — M25512 Pain in left shoulder: Secondary | ICD-10-CM | POA: Diagnosis not present

## 2018-03-18 DIAGNOSIS — Z1501 Genetic susceptibility to malignant neoplasm of breast: Secondary | ICD-10-CM | POA: Diagnosis not present

## 2018-03-18 DIAGNOSIS — Z809 Family history of malignant neoplasm, unspecified: Secondary | ICD-10-CM | POA: Diagnosis not present

## 2018-03-26 DIAGNOSIS — Z8041 Family history of malignant neoplasm of ovary: Secondary | ICD-10-CM | POA: Diagnosis not present

## 2018-03-26 DIAGNOSIS — Z1501 Genetic susceptibility to malignant neoplasm of breast: Secondary | ICD-10-CM | POA: Diagnosis not present

## 2018-03-26 DIAGNOSIS — N8300 Follicular cyst of ovary, unspecified side: Secondary | ICD-10-CM | POA: Diagnosis not present

## 2018-05-05 MED FILL — ALPRAZolam 0.5 MG TABS: 0.5 | 30 days supply | Qty: 30 | Fill #2

## 2018-05-09 MED FILL — CRYSELLE-28 TABLET: 0.3-30 | 84 days supply | Qty: 84 | Fill #0

## 2018-05-14 MED FILL — CLOPIDOGREL 75 MG TABLET: 75 | 90 days supply | Qty: 90 | Fill #1

## 2018-06-03 DIAGNOSIS — M67912 Unspecified disorder of synovium and tendon, left shoulder: Secondary | ICD-10-CM | POA: Diagnosis not present

## 2018-06-04 DIAGNOSIS — R630 Anorexia: Secondary | ICD-10-CM | POA: Diagnosis not present

## 2018-06-04 DIAGNOSIS — R1013 Epigastric pain: Secondary | ICD-10-CM | POA: Diagnosis not present

## 2018-06-04 DIAGNOSIS — R110 Nausea: Secondary | ICD-10-CM | POA: Diagnosis not present

## 2018-06-06 DIAGNOSIS — R109 Unspecified abdominal pain: Secondary | ICD-10-CM | POA: Diagnosis not present

## 2018-06-06 DIAGNOSIS — D72829 Elevated white blood cell count, unspecified: Secondary | ICD-10-CM | POA: Diagnosis not present

## 2018-06-06 DIAGNOSIS — R319 Hematuria, unspecified: Secondary | ICD-10-CM | POA: Diagnosis not present

## 2018-06-09 MED FILL — PANTOPRAZOLE SOD DR 40 MG T: 40 | 30 days supply | Qty: 30 | Fill #0

## 2018-06-24 MED FILL — ALPRAZolam 0.5 MG TABS: 0.5 | 30 days supply | Qty: 30 | Fill #3

## 2018-07-02 DIAGNOSIS — I7774 Dissection of vertebral artery: Secondary | ICD-10-CM | POA: Diagnosis not present

## 2018-07-15 DIAGNOSIS — M25512 Pain in left shoulder: Secondary | ICD-10-CM | POA: Diagnosis not present

## 2018-07-28 DIAGNOSIS — F4312 Post-traumatic stress disorder, chronic: Secondary | ICD-10-CM | POA: Diagnosis not present

## 2018-07-29 MED FILL — ALPRAZolam 0.5 MG TABS: 0.5 | 30 days supply | Qty: 30 | Fill #4

## 2018-08-04 DIAGNOSIS — F4312 Post-traumatic stress disorder, chronic: Secondary | ICD-10-CM | POA: Diagnosis not present

## 2018-08-04 MED FILL — ELINEST-28 TABLET: 0.3-30 | 84 days supply | Qty: 84 | Fill #0

## 2018-08-11 DIAGNOSIS — F4312 Post-traumatic stress disorder, chronic: Secondary | ICD-10-CM | POA: Diagnosis not present

## 2018-08-20 DIAGNOSIS — D2262 Melanocytic nevi of left upper limb, including shoulder: Secondary | ICD-10-CM | POA: Diagnosis not present

## 2018-08-20 DIAGNOSIS — D225 Melanocytic nevi of trunk: Secondary | ICD-10-CM | POA: Diagnosis not present

## 2018-08-20 DIAGNOSIS — L905 Scar conditions and fibrosis of skin: Secondary | ICD-10-CM | POA: Diagnosis not present

## 2018-08-20 DIAGNOSIS — D2261 Melanocytic nevi of right upper limb, including shoulder: Secondary | ICD-10-CM | POA: Diagnosis not present

## 2018-08-20 DIAGNOSIS — Q825 Congenital non-neoplastic nevus: Secondary | ICD-10-CM | POA: Diagnosis not present

## 2018-08-20 DIAGNOSIS — D2222 Melanocytic nevi of left ear and external auricular canal: Secondary | ICD-10-CM | POA: Diagnosis not present

## 2018-09-10 DIAGNOSIS — M19012 Primary osteoarthritis, left shoulder: Secondary | ICD-10-CM | POA: Diagnosis not present

## 2018-09-10 DIAGNOSIS — Z472 Encounter for removal of internal fixation device: Secondary | ICD-10-CM | POA: Diagnosis not present

## 2018-09-10 DIAGNOSIS — M79602 Pain in left arm: Secondary | ICD-10-CM | POA: Diagnosis not present

## 2018-09-10 DIAGNOSIS — T84328A Displacement of other bone devices, implants and grafts, initial encounter: Secondary | ICD-10-CM | POA: Diagnosis not present

## 2018-09-10 MED FILL — HYDROCODON-APAP 5-325: 5-325 | 5 days supply | Qty: 40 | Fill #0

## 2018-09-10 MED FILL — METHOCARBAMOL 500 MG TABLET: 500 | 10 days supply | Qty: 30 | Fill #0

## 2018-09-13 DIAGNOSIS — Z9889 Other specified postprocedural states: Secondary | ICD-10-CM | POA: Diagnosis not present

## 2018-09-30 DIAGNOSIS — Z9889 Other specified postprocedural states: Secondary | ICD-10-CM | POA: Diagnosis not present

## 2018-10-17 DIAGNOSIS — F419 Anxiety disorder, unspecified: Secondary | ICD-10-CM | POA: Diagnosis not present

## 2018-10-17 DIAGNOSIS — Z8041 Family history of malignant neoplasm of ovary: Secondary | ICD-10-CM | POA: Diagnosis not present

## 2018-10-17 DIAGNOSIS — Z803 Family history of malignant neoplasm of breast: Secondary | ICD-10-CM | POA: Diagnosis not present

## 2018-10-17 MED FILL — ALPRAZolam 0.5 MG TABS: 0.5 | 20 days supply | Qty: 20 | Fill #0

## 2018-10-17 MED FILL — CITALOPRAM HBR 10 MG TABLET: 10 | 30 days supply | Qty: 30 | Fill #0

## 2018-10-28 DIAGNOSIS — Z9889 Other specified postprocedural states: Secondary | ICD-10-CM | POA: Diagnosis not present

## 2018-10-28 MED FILL — GABAPENTIN 100 MG CAPSULE: 100 | 30 days supply | Qty: 30 | Fill #0

## 2018-11-06 ENCOUNTER — Other Ambulatory Visit: Payer: Self-pay | Admitting: Obstetrics and Gynecology

## 2018-11-06 DIAGNOSIS — Z1231 Encounter for screening mammogram for malignant neoplasm of breast: Secondary | ICD-10-CM

## 2018-11-10 ENCOUNTER — Ambulatory Visit
Admission: RE | Admit: 2018-11-10 | Discharge: 2018-11-10 | Disposition: A | Discharge status: Home / Self Care | Payer: 59 | Source: Ambulatory Visit

## 2018-11-10 ENCOUNTER — Ambulatory Visit
Admission: RE | Admit: 2018-11-10 | Discharge: 2018-11-10 | Disposition: A | Discharge status: Home / Self Care | Payer: 59 | Source: Ambulatory Visit | Attending: Obstetrics and Gynecology | Admitting: Obstetrics and Gynecology

## 2018-11-10 DIAGNOSIS — Z1231 Encounter for screening mammogram for malignant neoplasm of breast: Secondary | ICD-10-CM | POA: Diagnosis not present

## 2018-12-04 ENCOUNTER — Ambulatory Visit: Payer: 59

## 2018-12-09 DIAGNOSIS — H52221 Regular astigmatism, right eye: Secondary | ICD-10-CM | POA: Diagnosis not present

## 2018-12-09 DIAGNOSIS — H35433 Paving stone degeneration of retina, bilateral: Secondary | ICD-10-CM | POA: Diagnosis not present

## 2018-12-09 DIAGNOSIS — D3142 Benign neoplasm of left ciliary body: Secondary | ICD-10-CM | POA: Diagnosis not present

## 2018-12-09 DIAGNOSIS — H5213 Myopia, bilateral: Secondary | ICD-10-CM | POA: Diagnosis not present

## 2018-12-09 DIAGNOSIS — H524 Presbyopia: Secondary | ICD-10-CM | POA: Diagnosis not present

## 2018-12-09 DIAGNOSIS — H04123 Dry eye syndrome of bilateral lacrimal glands: Secondary | ICD-10-CM | POA: Diagnosis not present

## 2018-12-09 MED FILL — LOTEMAX SM 0.38 % GEL: 0.38 | 20 days supply | Qty: 5 | Fill #0

## 2018-12-09 MED FILL — RESTASIS 0.05% EYE EMULSION: 0.05 | 90 days supply | Qty: 180 | Fill #0

## 2019-01-27 DIAGNOSIS — G5622 Lesion of ulnar nerve, left upper limb: Secondary | ICD-10-CM | POA: Diagnosis not present

## 2019-01-27 DIAGNOSIS — M25512 Pain in left shoulder: Secondary | ICD-10-CM | POA: Diagnosis not present

## 2019-01-27 DIAGNOSIS — M542 Cervicalgia: Secondary | ICD-10-CM | POA: Diagnosis not present

## 2019-03-09 DIAGNOSIS — Z01419 Encounter for gynecological examination (general) (routine) without abnormal findings: Secondary | ICD-10-CM | POA: Diagnosis not present

## 2019-03-09 DIAGNOSIS — Z6823 Body mass index (BMI) 23.0-23.9, adult: Secondary | ICD-10-CM | POA: Diagnosis not present

## 2019-03-11 ENCOUNTER — Other Ambulatory Visit: Payer: Self-pay | Admitting: Obstetrics and Gynecology

## 2019-03-11 DIAGNOSIS — Z9189 Other specified personal risk factors, not elsewhere classified: Secondary | ICD-10-CM

## 2019-03-20 DIAGNOSIS — H9313 Tinnitus, bilateral: Secondary | ICD-10-CM | POA: Diagnosis not present

## 2019-03-20 DIAGNOSIS — M26622 Arthralgia of left temporomandibular joint: Secondary | ICD-10-CM | POA: Diagnosis not present

## 2019-03-20 DIAGNOSIS — H9202 Otalgia, left ear: Secondary | ICD-10-CM | POA: Diagnosis not present

## 2019-03-20 DIAGNOSIS — H6123 Impacted cerumen, bilateral: Secondary | ICD-10-CM | POA: Diagnosis not present

## 2019-04-20 DIAGNOSIS — Z803 Family history of malignant neoplasm of breast: Secondary | ICD-10-CM | POA: Diagnosis not present

## 2019-04-20 DIAGNOSIS — Z8041 Family history of malignant neoplasm of ovary: Secondary | ICD-10-CM | POA: Diagnosis not present
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
## 2019-05-07 DIAGNOSIS — M542 Cervicalgia: Secondary | ICD-10-CM | POA: Diagnosis not present

## 2019-05-07 DIAGNOSIS — G5632 Lesion of radial nerve, left upper limb: Secondary | ICD-10-CM | POA: Diagnosis not present

## 2019-05-07 DIAGNOSIS — G5601 Carpal tunnel syndrome, right upper limb: Secondary | ICD-10-CM | POA: Diagnosis not present

## 2019-05-07 DIAGNOSIS — M7712 Lateral epicondylitis, left elbow: Secondary | ICD-10-CM | POA: Diagnosis not present

## 2019-05-07 MED FILL — GABAPENTIN 300 MG CAPSULE: 300 | 30 days supply | Qty: 30 | Fill #0

## 2019-05-07 MED FILL — predniSONE 10 MG (21) TBPK: 10 | 6 days supply | Qty: 21 | Fill #0

## 2019-05-13 DIAGNOSIS — R6884 Jaw pain: Secondary | ICD-10-CM | POA: Diagnosis not present

## 2019-05-13 DIAGNOSIS — M542 Cervicalgia: Secondary | ICD-10-CM | POA: Diagnosis not present

## 2019-05-13 DIAGNOSIS — G5622 Lesion of ulnar nerve, left upper limb: Secondary | ICD-10-CM | POA: Diagnosis not present

## 2019-05-13 DIAGNOSIS — M7711 Lateral epicondylitis, right elbow: Secondary | ICD-10-CM | POA: Diagnosis not present

## 2019-05-21 DIAGNOSIS — M79601 Pain in right arm: Secondary | ICD-10-CM | POA: Diagnosis not present

## 2019-05-21 DIAGNOSIS — M79602 Pain in left arm: Secondary | ICD-10-CM | POA: Diagnosis not present

## 2019-05-21 DIAGNOSIS — M4692 Unspecified inflammatory spondylopathy, cervical region: Secondary | ICD-10-CM | POA: Diagnosis not present

## 2019-05-21 DIAGNOSIS — G5622 Lesion of ulnar nerve, left upper limb: Secondary | ICD-10-CM | POA: Diagnosis not present

## 2019-05-21 DIAGNOSIS — R6884 Jaw pain: Secondary | ICD-10-CM | POA: Diagnosis not present

## 2019-05-21 DIAGNOSIS — M542 Cervicalgia: Secondary | ICD-10-CM | POA: Diagnosis not present

## 2019-05-21 DIAGNOSIS — M7711 Lateral epicondylitis, right elbow: Secondary | ICD-10-CM | POA: Diagnosis not present

## 2019-05-27 DIAGNOSIS — M542 Cervicalgia: Secondary | ICD-10-CM | POA: Diagnosis not present

## 2019-05-27 DIAGNOSIS — R6884 Jaw pain: Secondary | ICD-10-CM | POA: Diagnosis not present

## 2019-05-27 DIAGNOSIS — M7711 Lateral epicondylitis, right elbow: Secondary | ICD-10-CM | POA: Diagnosis not present

## 2019-05-27 DIAGNOSIS — G5622 Lesion of ulnar nerve, left upper limb: Secondary | ICD-10-CM | POA: Diagnosis not present

## 2019-06-03 DIAGNOSIS — G5622 Lesion of ulnar nerve, left upper limb: Secondary | ICD-10-CM | POA: Diagnosis not present

## 2019-06-03 DIAGNOSIS — R6884 Jaw pain: Secondary | ICD-10-CM | POA: Diagnosis not present

## 2019-06-03 DIAGNOSIS — M542 Cervicalgia: Secondary | ICD-10-CM | POA: Diagnosis not present

## 2019-06-03 DIAGNOSIS — M7711 Lateral epicondylitis, right elbow: Secondary | ICD-10-CM | POA: Diagnosis not present

## 2019-06-12 DIAGNOSIS — M7711 Lateral epicondylitis, right elbow: Secondary | ICD-10-CM | POA: Diagnosis not present

## 2019-06-12 DIAGNOSIS — R6884 Jaw pain: Secondary | ICD-10-CM | POA: Diagnosis not present

## 2019-06-12 DIAGNOSIS — G5622 Lesion of ulnar nerve, left upper limb: Secondary | ICD-10-CM | POA: Diagnosis not present

## 2019-06-12 DIAGNOSIS — M542 Cervicalgia: Secondary | ICD-10-CM | POA: Diagnosis not present

## 2019-06-23 DIAGNOSIS — M542 Cervicalgia: Secondary | ICD-10-CM | POA: Diagnosis not present

## 2019-07-15 ENCOUNTER — Ambulatory Visit: Payer: 59 | Admitting: Neurology

## 2019-07-22 DIAGNOSIS — N3000 Acute cystitis without hematuria: Secondary | ICD-10-CM | POA: Diagnosis not present

## 2019-07-22 DIAGNOSIS — R310 Gross hematuria: Secondary | ICD-10-CM | POA: Diagnosis not present

## 2019-09-15 ENCOUNTER — Ambulatory Visit
Admission: RE | Admit: 2019-09-15 | Discharge: 2019-09-15 | Disposition: A | Discharge status: Home / Self Care | Payer: 59 | Source: Ambulatory Visit | Attending: Obstetrics and Gynecology | Admitting: Obstetrics and Gynecology

## 2019-09-15 DIAGNOSIS — Z9189 Other specified personal risk factors, not elsewhere classified: Secondary | ICD-10-CM

## 2019-09-15 DIAGNOSIS — Z803 Family history of malignant neoplasm of breast: Secondary | ICD-10-CM | POA: Diagnosis not present

## 2019-09-15 MED ORDER — GADOBUTROL 1 MMOL/ML IV SOLN
6.0000 mL | Freq: Once | INTRAVENOUS | Status: AC | PRN
  Administered 2019-09-15: 11:00:00 6 mL via INTRAVENOUS

## 2019-10-22 DIAGNOSIS — M79644 Pain in right finger(s): Secondary | ICD-10-CM | POA: Diagnosis not present

## 2019-11-04 DIAGNOSIS — Z8041 Family history of malignant neoplasm of ovary: Secondary | ICD-10-CM | POA: Diagnosis not present

## 2019-11-04 DIAGNOSIS — N83291 Other ovarian cyst, right side: Secondary | ICD-10-CM | POA: Diagnosis not present

## 2019-11-04 DIAGNOSIS — N83292 Other ovarian cyst, left side: Secondary | ICD-10-CM | POA: Diagnosis not present

## 2019-11-04 DIAGNOSIS — Z803 Family history of malignant neoplasm of breast: Secondary | ICD-10-CM | POA: Diagnosis not present

## 2019-12-09 ENCOUNTER — Other Ambulatory Visit: Payer: Self-pay | Admitting: Obstetrics and Gynecology

## 2019-12-09 DIAGNOSIS — Z1231 Encounter for screening mammogram for malignant neoplasm of breast: Secondary | ICD-10-CM

## 2019-12-24 ENCOUNTER — Other Ambulatory Visit: Payer: Self-pay

## 2019-12-24 ENCOUNTER — Ambulatory Visit
Admission: RE | Admit: 2019-12-24 | Discharge: 2019-12-24 | Disposition: A | Discharge status: Home / Self Care | Payer: 59 | Source: Ambulatory Visit | Attending: Obstetrics and Gynecology | Admitting: Obstetrics and Gynecology

## 2019-12-24 DIAGNOSIS — Z1231 Encounter for screening mammogram for malignant neoplasm of breast: Secondary | ICD-10-CM

## 2019-12-24 MED FILL — GABAPENTIN 300 MG CAPSULE: 300 | 30 days supply | Qty: 30 | Fill #0

## 2020-01-06 ENCOUNTER — Other Ambulatory Visit: Payer: Self-pay

## 2020-01-06 ENCOUNTER — Ambulatory Visit (INDEPENDENT_AMBULATORY_CARE_PROVIDER_SITE_OTHER)
Admission: RE | Admit: 2020-01-06 | Discharge: 2020-01-06 | Disposition: A | Discharge status: Home / Self Care | Payer: 59 | Source: Ambulatory Visit | Attending: Internal Medicine | Admitting: Internal Medicine

## 2020-01-06 DIAGNOSIS — R918 Other nonspecific abnormal finding of lung field: Secondary | ICD-10-CM | POA: Diagnosis not present

## 2020-01-06 DIAGNOSIS — R911 Solitary pulmonary nodule: Secondary | ICD-10-CM

## 2020-01-07 NOTE — Progress Notes (Signed)
Lungs/Pleura: 5 mm ground-glass nodule in the right upper lobe (3/51), unchanged from 02/10/2015. 5 mm peripheral right lower lobe nodule (3/105), also unchanged from 02/10/2015. Lungs are otherwise clear. No pleural fluid. Airway is unremarkable.

## 2020-03-21 DIAGNOSIS — Z01419 Encounter for gynecological examination (general) (routine) without abnormal findings: Secondary | ICD-10-CM | POA: Diagnosis not present

## 2020-03-21 DIAGNOSIS — Z6821 Body mass index (BMI) 21.0-21.9, adult: Secondary | ICD-10-CM | POA: Diagnosis not present

## 2020-03-21 DIAGNOSIS — F419 Anxiety disorder, unspecified: Secondary | ICD-10-CM | POA: Diagnosis not present

## 2020-03-21 DIAGNOSIS — Z113 Encounter for screening for infections with a predominantly sexual mode of transmission: Secondary | ICD-10-CM | POA: Diagnosis not present

## 2020-03-21 MED FILL — CITALOPRAM HBR 10 MG TABLET: 10 | 30 days supply | Qty: 30 | Fill #0

## 2020-05-18 DIAGNOSIS — Z20828 Contact with and (suspected) exposure to other viral communicable diseases: Secondary | ICD-10-CM | POA: Diagnosis not present

## 2020-05-25 DIAGNOSIS — Z7689 Persons encountering health services in other specified circumstances: Secondary | ICD-10-CM | POA: Diagnosis not present

## 2020-05-25 DIAGNOSIS — Z1501 Genetic susceptibility to malignant neoplasm of breast: Secondary | ICD-10-CM | POA: Diagnosis not present

## 2020-05-25 DIAGNOSIS — B373 Candidiasis of vulva and vagina: Secondary | ICD-10-CM | POA: Diagnosis not present

## 2020-06-01 DIAGNOSIS — Z1329 Encounter for screening for other suspected endocrine disorder: Secondary | ICD-10-CM | POA: Diagnosis not present

## 2020-06-01 DIAGNOSIS — Z13220 Encounter for screening for lipoid disorders: Secondary | ICD-10-CM | POA: Diagnosis not present

## 2020-06-01 DIAGNOSIS — Z1321 Encounter for screening for nutritional disorder: Secondary | ICD-10-CM | POA: Diagnosis not present

## 2020-06-01 DIAGNOSIS — Z131 Encounter for screening for diabetes mellitus: Secondary | ICD-10-CM | POA: Diagnosis not present

## 2020-06-01 DIAGNOSIS — Z13228 Encounter for screening for other metabolic disorders: Secondary | ICD-10-CM | POA: Diagnosis not present

## 2020-07-21 DIAGNOSIS — N83202 Unspecified ovarian cyst, left side: Secondary | ICD-10-CM | POA: Diagnosis not present

## 2020-12-19 ENCOUNTER — Other Ambulatory Visit: Payer: Self-pay | Admitting: Obstetrics and Gynecology

## 2020-12-19 DIAGNOSIS — Z1231 Encounter for screening mammogram for malignant neoplasm of breast: Secondary | ICD-10-CM

## 2021-01-19 ENCOUNTER — Other Ambulatory Visit (HOSPITAL_COMMUNITY): Payer: Self-pay

## 2021-01-19 DIAGNOSIS — M79641 Pain in right hand: Secondary | ICD-10-CM | POA: Diagnosis not present

## 2021-01-19 DIAGNOSIS — Z7689 Persons encountering health services in other specified circumstances: Secondary | ICD-10-CM | POA: Diagnosis not present

## 2021-01-19 DIAGNOSIS — M79642 Pain in left hand: Secondary | ICD-10-CM | POA: Diagnosis not present

## 2021-01-19 DIAGNOSIS — Z1502 Genetic susceptibility to malignant neoplasm of ovary: Secondary | ICD-10-CM | POA: Diagnosis not present

## 2021-01-19 DIAGNOSIS — M67912 Unspecified disorder of synovium and tendon, left shoulder: Secondary | ICD-10-CM | POA: Diagnosis not present

## 2021-01-19 MED ORDER — GABAPENTIN 300 MG PO CAPS
1.0000 | ORAL_CAPSULE | Freq: Every day | ORAL | 0 refills | Status: AC
  Filled 2021-01-19: qty 30, 30d supply, fill #0

## 2021-01-19 MED ORDER — PREDNISONE 10 MG (21) PO TBPK
ORAL_TABLET | ORAL | 0 refills | Status: AC
  Filled 2021-01-19: qty 21, 6d supply, fill #0

## 2021-02-01 ENCOUNTER — Ambulatory Visit
Admission: RE | Admit: 2021-02-01 | Discharge: 2021-02-01 | Disposition: A | Discharge status: Home / Self Care | Payer: 59 | Source: Ambulatory Visit | Attending: Obstetrics and Gynecology | Admitting: Obstetrics and Gynecology

## 2021-02-01 ENCOUNTER — Other Ambulatory Visit: Payer: Self-pay

## 2021-02-01 DIAGNOSIS — Z1231 Encounter for screening mammogram for malignant neoplasm of breast: Secondary | ICD-10-CM | POA: Diagnosis not present

## 2021-02-10 DIAGNOSIS — R200 Anesthesia of skin: Secondary | ICD-10-CM | POA: Diagnosis not present

## 2021-03-06 DIAGNOSIS — Z113 Encounter for screening for infections with a predominantly sexual mode of transmission: Secondary | ICD-10-CM | POA: Diagnosis not present

## 2021-03-06 DIAGNOSIS — Z6823 Body mass index (BMI) 23.0-23.9, adult: Secondary | ICD-10-CM | POA: Diagnosis not present

## 2021-03-06 DIAGNOSIS — Z01419 Encounter for gynecological examination (general) (routine) without abnormal findings: Secondary | ICD-10-CM | POA: Diagnosis not present

## 2021-03-06 DIAGNOSIS — R635 Abnormal weight gain: Secondary | ICD-10-CM | POA: Diagnosis not present

## 2021-03-08 DIAGNOSIS — R200 Anesthesia of skin: Secondary | ICD-10-CM | POA: Diagnosis not present

## 2021-03-14 DIAGNOSIS — M7702 Medial epicondylitis, left elbow: Secondary | ICD-10-CM | POA: Diagnosis not present

## 2021-03-14 DIAGNOSIS — M7701 Medial epicondylitis, right elbow: Secondary | ICD-10-CM | POA: Diagnosis not present

## 2021-03-14 DIAGNOSIS — M7712 Lateral epicondylitis, left elbow: Secondary | ICD-10-CM | POA: Diagnosis not present

## 2021-03-14 DIAGNOSIS — M7711 Lateral epicondylitis, right elbow: Secondary | ICD-10-CM | POA: Diagnosis not present

## 2021-04-10 DIAGNOSIS — D1724 Benign lipomatous neoplasm of skin and subcutaneous tissue of left leg: Secondary | ICD-10-CM | POA: Diagnosis not present

## 2021-05-04 DIAGNOSIS — L720 Epidermal cyst: Secondary | ICD-10-CM | POA: Diagnosis not present

## 2021-07-06 DIAGNOSIS — N760 Acute vaginitis: Secondary | ICD-10-CM | POA: Diagnosis not present

## 2021-07-06 DIAGNOSIS — Z113 Encounter for screening for infections with a predominantly sexual mode of transmission: Secondary | ICD-10-CM | POA: Diagnosis not present

## 2021-09-25 ENCOUNTER — Telehealth: Payer: 59 | Admitting: Physician Assistant

## 2021-09-25 DIAGNOSIS — J069 Acute upper respiratory infection, unspecified: Secondary | ICD-10-CM | POA: Diagnosis not present

## 2021-09-25 MED ORDER — PSEUDOEPH-BROMPHEN-DM 30-2-10 MG/5ML PO SYRP: 5.0000 mL | ORAL_SOLUTION | Freq: Four times a day (QID) | ORAL | 0 refills | Status: AC | PRN

## 2021-09-25 MED ORDER — BENZONATATE 100 MG PO CAPS: 100.0000 mg | ORAL_CAPSULE | Freq: Three times a day (TID) | ORAL | 0 refills | Status: AC | PRN

## 2021-09-25 MED ORDER — IPRATROPIUM BROMIDE 0.03 % NA SOLN: 2.0000 | Freq: Two times a day (BID) | NASAL | 0 refills | Status: AC

## 2021-09-25 MED ORDER — LIDOCAINE VISCOUS HCL 2 % MT SOLN: OROMUCOSAL | 0 refills | Status: AC

## 2021-09-25 NOTE — Progress Notes (Signed)
Virtual Visit Consent   Leah Wu, you are scheduled for a virtual visit with a [facility] provider today.     Just as with appointments in the office, your consent must be obtained to participate.  Your consent will be active for this visit and any virtual visit you may have with one of our providers in the next 365 days.     If you have a MyChart account, a copy of this consent can be sent to you electronically.  All virtual visits are billed to your insurance company just like a traditional visit in the office.    As this is a virtual visit, video technology does not allow for your provider to perform a traditional examination.  This may limit your provider's ability to fully assess your condition.  If your provider identifies any concerns that need to be evaluated in person or the need to arrange testing (such as labs, EKG, etc.), we will make arrangements to do so.     Although advances in technology are sophisticated, we cannot ensure that it will always work on either your end or our end.  If the connection with a video visit is poor, the visit may have to be switched to a telephone visit.  With either a video or telephone visit, we are not always able to ensure that we have a secure connection.     I need to obtain your verbal consent now.   Are you willing to proceed with your visit today?    Leah Wu has provided verbal consent on 09/25/2021 for a virtual visit (video or telephone).   Mar Daring, PA-C   Date: 09/25/2021 11:22 AM   Virtual Visit via Video Note   I, Mar Daring, connected with  Leah Wu  ([id number], Aug 23, 1971) on 09/25/21 at 11:15 AM EST by a video-enabled telemedicine application and verified that I am speaking with the correct person using two identifiers.  Location: Patient: Virtual Visit Location Patient: Home Provider: Virtual Visit Location Provider: Home Office   I discussed the limitations of evaluation and  management by telemedicine and the availability of in person appointments. The patient expressed understanding and agreed to proceed.    History of Present Illness: Leah Wu is a 51 y.o. who identifies as a female who was assigned female at birth, and is being seen today for URI symptoms.  HPI: URI  This is a new problem. The current episode started in the past 7 days (Thursday, 09/21/21, started with post nasal drainage). The problem has been gradually worsening. The maximum temperature recorded prior to her arrival was 102 - 102.9 F (102.6). The fever has been present for 1 to 2 days. Associated symptoms include congestion, coughing, headaches, a plugged ear sensation (mild), rhinorrhea, sinus pain and a sore throat. Pertinent negatives include no diarrhea, ear pain, nausea or vomiting. Associated symptoms comments: Post nasal drainage. Treatments tried: dual action advil, salt water gargles, vitamins, sudafed, Mucinex. The treatment provided mild relief.     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    Allergies:  Allergies  Allergen Reactions   Elemental Sulfur    Medications:  Current Outpatient Medications:    benzonatate (TESSALON) 100 MG capsule, Take 1 capsule (100 mg total) by mouth 3 (three) times daily as needed., Disp: 30 capsule, Rfl: 0  brompheniramine-pseudoephedrine-DM 30-2-10 MG/5ML syrup, Take 5 mLs by mouth 4 (four) times daily as needed., Disp: 120 mL, Rfl: 0   ipratropium (ATROVENT) 0.03 % nasal spray, Place 2 sprays into both nostrils every 12 (twelve) hours., Disp: 30 mL, Rfl: 0   lidocaine (XYLOCAINE) 2 % solution, 86mL swish and swallow every 4 hours as needed, Disp: 100 mL, Rfl: 0   ALPRAZolam (XANAX) 1 MG tablet, Take 1 mg by mouth at bedtime as needed for anxiety. States she takes 0.5 mg  qhs prn  (1/2 tab), Disp: , Rfl:     amoxicillin-clavulanate (AUGMENTIN) 875-125 MG tablet, Take 1 tablet by mouth 2 (two) times daily. (Patient not taking: Reported on 01/02/2018), Disp: 14 tablet, Rfl: 0   aspirin EC 81 MG tablet, Take 81 mg by mouth daily., Disp: , Rfl:    bisacodyl (DULCOLAX) 5 MG EC tablet, Take 5 mg by mouth daily as needed for moderate constipation., Disp: , Rfl:    celecoxib (CELEBREX) 100 MG capsule, Take 100 mg by mouth 2 (two) times daily., Disp: , Rfl:    ciprofloxacin (CIPRO) 500 MG tablet, Take 1 tablet (500 mg total) by mouth 2 (two) times daily. (Patient not taking: Reported on 01/02/2018), Disp: 10 tablet, Rfl: 0   clopidogrel (PLAVIX) 75 MG tablet, Take 75 mg by mouth daily., Disp: , Rfl:    gabapentin (NEURONTIN) 300 MG capsule, Take 1 capsule by mouth at bedtime, Disp: 30 capsule, Rfl: 0   methocarbamol (ROBAXIN) 500 MG tablet, Take 500 mg by mouth every 8 (eight) hours as needed for muscle spasms., Disp: , Rfl:    norgestrel-ethinyl estradiol (LO/OVRAL,CRYSELLE) 0.3-30 MG-MCG tablet, Take 1 tablet by mouth daily., Disp: , Rfl:    oxyCODONE-acetaminophen (PERCOCET/ROXICET) 5-325 MG tablet, Take 1 tablet by mouth every 8 (eight) hours as needed for severe pain., Disp: , Rfl:    predniSONE (STERAPRED UNI-PAK 21 TAB) 10 MG (21) TBPK tablet, TAKE AS DIRECTED PER PACKAGE INSTUCTIONS, Disp: 21 tablet, Rfl: 0   traMADol (ULTRAM) 50 MG tablet, Take 50 mg by mouth every 6 (six) hours as needed., Disp: , Rfl:   Observations/Objective: Patient is well-developed, well-nourished in no acute distress.  Resting comfortably at home.  Head is normocephalic, atraumatic.  No labored breathing.  Speech is clear and coherent with logical content.  Patient is alert and oriented at baseline.    Assessment and Plan: 1. Viral URI with cough - ipratropium (ATROVENT) 0.03 % nasal spray; Place 2 sprays into both nostrils every 12 (twelve) hours.  Dispense: 30 mL; Refill: 0 - lidocaine (XYLOCAINE) 2 % solution; 69mL swish  and swallow every 4 hours as needed  Dispense: 100 mL; Refill: 0 - brompheniramine-pseudoephedrine-DM 30-2-10 MG/5ML syrup; Take 5 mLs by mouth 4 (four) times daily as needed.  Dispense: 120 mL; Refill: 0 - benzonatate (TESSALON) 100 MG capsule; Take 1 capsule (100 mg total) by mouth 3 (three) times daily as needed.  Dispense: 30 capsule; Refill: 0  - Suspect viral illness - Add ipratropium, Bromfed DM, tessalon perles, and viscous lidocaine - Continue dual action Advil and Mucinex - Push fluids - Rest as needed - Seek in person evaluation if symptoms worsen or fail to improve  Follow Up Instructions: I discussed the assessment and treatment plan with the patient. The patient was provided an opportunity to ask questions and all were answered. The patient agreed with the plan and demonstrated an understanding of the instructions.  A copy of instructions were sent to the patient  via MyChart unless otherwise noted below.    The patient was advised to call back or seek an in-person evaluation if the symptoms worsen or if the condition fails to improve as anticipated.  Time:  I spent 15 minutes with the patient via telehealth technology discussing the above problems/concerns.    Mar Daring, PA-C

## 2021-09-25 NOTE — Patient Instructions (Signed)
Silvano Rusk, thank you for joining Mar Daring, PA-C for today's virtual visit.  While this provider is not your primary care provider (PCP), if your PCP is located in our provider database this encounter information will be shared with them immediately following your visit.  Consent: (Patient) Leah Wu provided verbal consent for this virtual visit at the beginning of the encounter.  Current Medications:  Current Outpatient Medications:    benzonatate (TESSALON) 100 MG capsule, Take 1 capsule (100 mg total) by mouth 3 (three) times daily as needed., Disp: 30 capsule, Rfl: 0   brompheniramine-pseudoephedrine-DM 30-2-10 MG/5ML syrup, Take 5 mLs by mouth 4 (four) times daily as needed., Disp: 120 mL, Rfl: 0   ipratropium (ATROVENT) 0.03 % nasal spray, Place 2 sprays into both nostrils every 12 (twelve) hours., Disp: 30 mL, Rfl: 0   lidocaine (XYLOCAINE) 2 % solution, 87mL swish and swallow every 4 hours as needed, Disp: 100 mL, Rfl: 0   ALPRAZolam (XANAX) 1 MG tablet, Take 1 mg by mouth at bedtime as needed for anxiety. States she takes 0.5 mg  qhs prn  (1/2 tab), Disp: , Rfl:    amoxicillin-clavulanate (AUGMENTIN) 875-125 MG tablet, Take 1 tablet by mouth 2 (two) times daily. (Patient not taking: Reported on 01/02/2018), Disp: 14 tablet, Rfl: 0   aspirin EC 81 MG tablet, Take 81 mg by mouth daily., Disp: , Rfl:    bisacodyl (DULCOLAX) 5 MG EC tablet, Take 5 mg by mouth daily as needed for moderate constipation., Disp: , Rfl:    celecoxib (CELEBREX) 100 MG capsule, Take 100 mg by mouth 2 (two) times daily., Disp: , Rfl:    ciprofloxacin (CIPRO) 500 MG tablet, Take 1 tablet (500 mg total) by mouth 2 (two) times daily. (Patient not taking: Reported on 01/02/2018), Disp: 10 tablet, Rfl: 0   clopidogrel (PLAVIX) 75 MG tablet, Take 75 mg by mouth daily., Disp: , Rfl:    gabapentin (NEURONTIN) 300 MG capsule, Take 1 capsule by mouth at bedtime, Disp: 30 capsule, Rfl: 0   methocarbamol  (ROBAXIN) 500 MG tablet, Take 500 mg by mouth every 8 (eight) hours as needed for muscle spasms., Disp: , Rfl:    norgestrel-ethinyl estradiol (LO/OVRAL,CRYSELLE) 0.3-30 MG-MCG tablet, Take 1 tablet by mouth daily., Disp: , Rfl:    oxyCODONE-acetaminophen (PERCOCET/ROXICET) 5-325 MG tablet, Take 1 tablet by mouth every 8 (eight) hours as needed for severe pain., Disp: , Rfl:    predniSONE (STERAPRED UNI-PAK 21 TAB) 10 MG (21) TBPK tablet, TAKE AS DIRECTED PER PACKAGE INSTUCTIONS, Disp: 21 tablet, Rfl: 0   traMADol (ULTRAM) 50 MG tablet, Take 50 mg by mouth every 6 (six) hours as needed., Disp: , Rfl:    Medications ordered in this encounter:  Meds ordered this encounter  Medications   ipratropium (ATROVENT) 0.03 % nasal spray    Sig: Place 2 sprays into both nostrils every 12 (twelve) hours.    Dispense:  30 mL    Refill:  0    Order Specific Question:   Supervising Provider    Answer:   MILLER, BRIAN [[id number]]   lidocaine (XYLOCAINE) 2 % solution    Sig: 11mL swish and swallow every 4 hours as needed    Dispense:  100 mL    Refill:  0    Order Specific Question:   Supervising Provider    Answer:   Sabra Heck, BRIAN [[id number]]   brompheniramine-pseudoephedrine-DM 30-2-10 MG/5ML syrup    Sig: Take 5 mLs by  mouth 4 (four) times daily as needed.    Dispense:  120 mL    Refill:  0    Order Specific Question:   Supervising Provider    Answer:   MILLER, BRIAN [[id number]]   benzonatate (TESSALON) 100 MG capsule    Sig: Take 1 capsule (100 mg total) by mouth 3 (three) times daily as needed.    Dispense:  30 capsule    Refill:  0    Order Specific Question:   Supervising Provider    Answer:   Sabra Heck, [place]     *If you need refills on other medications prior to your next appointment, please contact your pharmacy*  Follow-Up: Call back or seek an in-person evaluation if the symptoms worsen or if the condition fails to improve as anticipated.  Other Instructions Viral Respiratory Infection A  respiratory infection is an illness that affects part of the respiratory system, such as the lungs, nose, or throat. A respiratory infection that is caused by a virus is called a viral respiratory infection. Common types of viral respiratory infections include: A cold. The flu (influenza). A respiratory syncytial virus (RSV) infection. What are the causes? This condition is caused by a virus. The virus may spread through contact with droplets or direct contact with infected people or their mucus or secretions. The virus may spread from person to person (is contagious). What are the signs or symptoms? Symptoms of this condition include: A stuffy or runny nose. A sore throat or cough. Shortness of breath or difficulty breathing. Yellow or green mucus (sputum). Other symptoms may include: A fever. Sweating or chills. Fatigue. Achy muscles. A headache. How is this diagnosed? This condition may be diagnosed based on: Your symptoms. A physical exam. Testing of secretions from the nose or throat. Chest X-ray. How is this treated? This condition may be treated with medicines, such as: Antiviral medicine. This may shorten the length of time a person has symptoms. Expectorants. These make it easier to cough up mucus. Decongestant nasal sprays. Acetaminophen or NSAIDs, such as ibuprofen, to relieve fever and pain. Antibiotic medicines are not prescribed for viral infections.This is because antibiotics are designed to kill bacteria. They do not kill viruses. Follow these instructions at home: Managing pain and congestion Take over-the-counter and prescription medicines only as told by your health care provider. If you have a sore throat, gargle with a mixture of salt and water 3-4 times a day or as needed. To make salt water, completely dissolve -1 tsp (3-6 g) of salt in 1 cup (237 mL) of warm water. Use nose drops made from salt water to ease congestion and soften raw skin around your  nose. Take 2 tsp (10 mL) of honey at bedtime to lessen coughing at night. Do not give honey to children who are younger than 1 year. Drink enough fluid to keep your urine pale yellow. This helps prevent dehydration and helps loosen up mucus. General instructions  Rest as much as possible. Do not drink alcohol. Do not use any products that contain nicotine or tobacco. These products include cigarettes, chewing tobacco, and vaping devices, such as e-cigarettes. If you need help quitting, ask your health care provider. Keep all follow-up visits. This is important. How is this prevented?   Get an annual flu shot. You may get the flu shot in late summer, fall, or winter. Ask your health care provider when you should get your flu shot. Avoid spreading your infection to other people. If you  are sick: Wash your hands with soap and water often, especially after you cough or sneeze. Wash for at least 20 seconds. If soap and water are not available, use alcohol-based hand sanitizer. Cover your mouth when you cough. Cover your nose and mouth when you sneeze. Do not share cups or eating utensils. Clean commonly used objects often. Clean commonly touched surfaces. Stay home from work or school as told by your health care provider. Avoid contact with people who are sick during cold and flu season. This is generally fall and winter. Contact a health care provider if: Your symptoms last for 10 days or longer. Your symptoms get worse over time. You have severe sinus pain in your face or forehead. The glands in your jaw or neck become very swollen. You have shortness of breath. Get help right away if you: Feel pain or pressure in your chest. Have trouble breathing. Faint or feel like you will faint. Have severe and persistent vomiting. Feel confused or disoriented. These symptoms may represent a serious problem that is an emergency. Do not wait to see if the symptoms will go away. Get medical help right  away. Call your local emergency services (911 in the [place] Do not drive yourself to the hospital. Summary A respiratory infection is an illness that affects part of the respiratory system, such as the lungs, nose, or throat. A respiratory infection that is caused by a virus is called a viral respiratory infection. Common types of viral respiratory infections include a cold, influenza, and respiratory syncytial virus (RSV) infection. Symptoms of this condition include a stuffy or runny nose, cough, fatigue, achy muscles, sore throat, and fevers or chills. Antibiotic medicines are not prescribed for viral infections. This is because antibiotics are designed to kill bacteria. They are not effective against viruses. This information is not intended to replace advice given to you by your health care provider. Make sure you discuss any questions you have with your health care provider. Document Revised: 12/15/2020 Document Reviewed: 12/15/2020 Elsevier Patient Education  2022 [employer].    If you have been instructed to have an in-person evaluation today at a local Urgent Care facility, please use the link below. It will take you to a list of all of our available [facility], including address, phone number and hours of operation. Please do not delay care.  [facility]  If you or a family member do not have a primary care provider, use the link below to schedule a visit and establish care. When you choose a [facility] primary care physician or advanced practice provider, you gain a long-term partner in health. Find a Primary Care Provider  Learn more about [facility]'s in-office and virtual care options: [facility]

## 2021-12-01 DIAGNOSIS — M62830 Muscle spasm of back: Secondary | ICD-10-CM | POA: Diagnosis not present

## 2021-12-01 DIAGNOSIS — M5413 Radiculopathy, cervicothoracic region: Secondary | ICD-10-CM | POA: Diagnosis not present

## 2021-12-01 DIAGNOSIS — M9904 Segmental and somatic dysfunction of sacral region: Secondary | ICD-10-CM | POA: Diagnosis not present

## 2021-12-01 DIAGNOSIS — M542 Cervicalgia: Secondary | ICD-10-CM | POA: Diagnosis not present

## 2021-12-11 DIAGNOSIS — M9904 Segmental and somatic dysfunction of sacral region: Secondary | ICD-10-CM | POA: Diagnosis not present

## 2021-12-11 DIAGNOSIS — M542 Cervicalgia: Secondary | ICD-10-CM | POA: Diagnosis not present

## 2021-12-11 DIAGNOSIS — M5413 Radiculopathy, cervicothoracic region: Secondary | ICD-10-CM | POA: Diagnosis not present

## 2021-12-11 DIAGNOSIS — M62830 Muscle spasm of back: Secondary | ICD-10-CM | POA: Diagnosis not present

## 2021-12-13 DIAGNOSIS — M542 Cervicalgia: Secondary | ICD-10-CM | POA: Diagnosis not present

## 2021-12-13 DIAGNOSIS — M5413 Radiculopathy, cervicothoracic region: Secondary | ICD-10-CM | POA: Diagnosis not present

## 2021-12-13 DIAGNOSIS — M62830 Muscle spasm of back: Secondary | ICD-10-CM | POA: Diagnosis not present

## 2021-12-13 DIAGNOSIS — M9904 Segmental and somatic dysfunction of sacral region: Secondary | ICD-10-CM | POA: Diagnosis not present

## 2021-12-15 DIAGNOSIS — M62830 Muscle spasm of back: Secondary | ICD-10-CM | POA: Diagnosis not present

## 2021-12-15 DIAGNOSIS — M5413 Radiculopathy, cervicothoracic region: Secondary | ICD-10-CM | POA: Diagnosis not present

## 2021-12-15 DIAGNOSIS — M542 Cervicalgia: Secondary | ICD-10-CM | POA: Diagnosis not present

## 2021-12-15 DIAGNOSIS — M9904 Segmental and somatic dysfunction of sacral region: Secondary | ICD-10-CM | POA: Diagnosis not present

## 2021-12-18 DIAGNOSIS — M62830 Muscle spasm of back: Secondary | ICD-10-CM | POA: Diagnosis not present

## 2021-12-18 DIAGNOSIS — M542 Cervicalgia: Secondary | ICD-10-CM | POA: Diagnosis not present

## 2021-12-18 DIAGNOSIS — M9904 Segmental and somatic dysfunction of sacral region: Secondary | ICD-10-CM | POA: Diagnosis not present

## 2021-12-18 DIAGNOSIS — M5413 Radiculopathy, cervicothoracic region: Secondary | ICD-10-CM | POA: Diagnosis not present

## 2021-12-19 DIAGNOSIS — M5413 Radiculopathy, cervicothoracic region: Secondary | ICD-10-CM | POA: Diagnosis not present

## 2021-12-19 DIAGNOSIS — M542 Cervicalgia: Secondary | ICD-10-CM | POA: Diagnosis not present

## 2021-12-19 DIAGNOSIS — M62830 Muscle spasm of back: Secondary | ICD-10-CM | POA: Diagnosis not present

## 2021-12-19 DIAGNOSIS — M9904 Segmental and somatic dysfunction of sacral region: Secondary | ICD-10-CM | POA: Diagnosis not present

## 2021-12-21 DIAGNOSIS — M9904 Segmental and somatic dysfunction of sacral region: Secondary | ICD-10-CM | POA: Diagnosis not present

## 2021-12-21 DIAGNOSIS — M5413 Radiculopathy, cervicothoracic region: Secondary | ICD-10-CM | POA: Diagnosis not present

## 2021-12-21 DIAGNOSIS — M62830 Muscle spasm of back: Secondary | ICD-10-CM | POA: Diagnosis not present

## 2021-12-21 DIAGNOSIS — M542 Cervicalgia: Secondary | ICD-10-CM | POA: Diagnosis not present

## 2022-01-02 DIAGNOSIS — M62830 Muscle spasm of back: Secondary | ICD-10-CM | POA: Diagnosis not present

## 2022-01-02 DIAGNOSIS — M542 Cervicalgia: Secondary | ICD-10-CM | POA: Diagnosis not present

## 2022-01-02 DIAGNOSIS — M5413 Radiculopathy, cervicothoracic region: Secondary | ICD-10-CM | POA: Diagnosis not present

## 2022-01-02 DIAGNOSIS — M9904 Segmental and somatic dysfunction of sacral region: Secondary | ICD-10-CM | POA: Diagnosis not present

## 2022-01-03 DIAGNOSIS — M542 Cervicalgia: Secondary | ICD-10-CM | POA: Diagnosis not present

## 2022-01-03 DIAGNOSIS — M5413 Radiculopathy, cervicothoracic region: Secondary | ICD-10-CM | POA: Diagnosis not present

## 2022-01-03 DIAGNOSIS — M9904 Segmental and somatic dysfunction of sacral region: Secondary | ICD-10-CM | POA: Diagnosis not present

## 2022-01-03 DIAGNOSIS — M62830 Muscle spasm of back: Secondary | ICD-10-CM | POA: Diagnosis not present

## 2022-01-05 DIAGNOSIS — M542 Cervicalgia: Secondary | ICD-10-CM | POA: Diagnosis not present

## 2022-01-05 DIAGNOSIS — M9904 Segmental and somatic dysfunction of sacral region: Secondary | ICD-10-CM | POA: Diagnosis not present

## 2022-01-05 DIAGNOSIS — M5413 Radiculopathy, cervicothoracic region: Secondary | ICD-10-CM | POA: Diagnosis not present

## 2022-01-05 DIAGNOSIS — M62830 Muscle spasm of back: Secondary | ICD-10-CM | POA: Diagnosis not present

## 2022-01-08 DIAGNOSIS — M542 Cervicalgia: Secondary | ICD-10-CM | POA: Diagnosis not present

## 2022-01-08 DIAGNOSIS — M62830 Muscle spasm of back: Secondary | ICD-10-CM | POA: Diagnosis not present

## 2022-01-08 DIAGNOSIS — M9904 Segmental and somatic dysfunction of sacral region: Secondary | ICD-10-CM | POA: Diagnosis not present

## 2022-01-08 DIAGNOSIS — M5413 Radiculopathy, cervicothoracic region: Secondary | ICD-10-CM | POA: Diagnosis not present

## 2022-01-09 DIAGNOSIS — M62830 Muscle spasm of back: Secondary | ICD-10-CM | POA: Diagnosis not present

## 2022-01-09 DIAGNOSIS — M5413 Radiculopathy, cervicothoracic region: Secondary | ICD-10-CM | POA: Diagnosis not present

## 2022-01-09 DIAGNOSIS — M9904 Segmental and somatic dysfunction of sacral region: Secondary | ICD-10-CM | POA: Diagnosis not present

## 2022-01-09 DIAGNOSIS — M542 Cervicalgia: Secondary | ICD-10-CM | POA: Diagnosis not present

## 2022-01-10 DIAGNOSIS — M62830 Muscle spasm of back: Secondary | ICD-10-CM | POA: Diagnosis not present

## 2022-01-10 DIAGNOSIS — M5413 Radiculopathy, cervicothoracic region: Secondary | ICD-10-CM | POA: Diagnosis not present

## 2022-01-10 DIAGNOSIS — M9904 Segmental and somatic dysfunction of sacral region: Secondary | ICD-10-CM | POA: Diagnosis not present

## 2022-01-10 DIAGNOSIS — M542 Cervicalgia: Secondary | ICD-10-CM | POA: Diagnosis not present

## 2022-01-15 DIAGNOSIS — M542 Cervicalgia: Secondary | ICD-10-CM | POA: Diagnosis not present

## 2022-01-15 DIAGNOSIS — M9904 Segmental and somatic dysfunction of sacral region: Secondary | ICD-10-CM | POA: Diagnosis not present

## 2022-01-15 DIAGNOSIS — M62830 Muscle spasm of back: Secondary | ICD-10-CM | POA: Diagnosis not present

## 2022-01-15 DIAGNOSIS — M5413 Radiculopathy, cervicothoracic region: Secondary | ICD-10-CM | POA: Diagnosis not present

## 2022-02-07 ENCOUNTER — Other Ambulatory Visit: Payer: Self-pay | Admitting: Obstetrics and Gynecology

## 2022-02-07 DIAGNOSIS — Z1231 Encounter for screening mammogram for malignant neoplasm of breast: Secondary | ICD-10-CM

## 2022-02-08 ENCOUNTER — Ambulatory Visit
Admission: RE | Admit: 2022-02-08 | Discharge: 2022-02-08 | Disposition: A | Discharge status: Home / Self Care | Payer: 59 | Source: Ambulatory Visit | Attending: Obstetrics and Gynecology | Admitting: Obstetrics and Gynecology

## 2022-02-08 DIAGNOSIS — Z1231 Encounter for screening mammogram for malignant neoplasm of breast: Secondary | ICD-10-CM

## 2022-02-09 ENCOUNTER — Other Ambulatory Visit: Payer: Self-pay | Admitting: Obstetrics and Gynecology

## 2022-02-09 DIAGNOSIS — R928 Other abnormal and inconclusive findings on diagnostic imaging of breast: Secondary | ICD-10-CM

## 2022-02-15 ENCOUNTER — Ambulatory Visit
Admission: RE | Admit: 2022-02-15 | Discharge: 2022-02-15 | Disposition: A | Discharge status: Home / Self Care | Payer: 59 | Source: Ambulatory Visit | Attending: Obstetrics and Gynecology | Admitting: Obstetrics and Gynecology

## 2022-02-15 ENCOUNTER — Ambulatory Visit: Payer: 59

## 2022-02-15 DIAGNOSIS — R922 Inconclusive mammogram: Secondary | ICD-10-CM | POA: Diagnosis not present

## 2022-02-15 DIAGNOSIS — R928 Other abnormal and inconclusive findings on diagnostic imaging of breast: Secondary | ICD-10-CM

## 2022-02-28 ENCOUNTER — Other Ambulatory Visit (HOSPITAL_COMMUNITY): Payer: Self-pay | Admitting: Obstetrics and Gynecology

## 2022-02-28 ENCOUNTER — Other Ambulatory Visit: Payer: Self-pay | Admitting: Obstetrics and Gynecology

## 2022-02-28 DIAGNOSIS — Z803 Family history of malignant neoplasm of breast: Secondary | ICD-10-CM

## 2022-03-07 DIAGNOSIS — Z1151 Encounter for screening for human papillomavirus (HPV): Secondary | ICD-10-CM | POA: Diagnosis not present

## 2022-03-07 DIAGNOSIS — Z6823 Body mass index (BMI) 23.0-23.9, adult: Secondary | ICD-10-CM | POA: Diagnosis not present

## 2022-03-07 DIAGNOSIS — Z124 Encounter for screening for malignant neoplasm of cervix: Secondary | ICD-10-CM | POA: Diagnosis not present

## 2022-03-07 DIAGNOSIS — Z01419 Encounter for gynecological examination (general) (routine) without abnormal findings: Secondary | ICD-10-CM | POA: Diagnosis not present

## 2022-03-07 DIAGNOSIS — N926 Irregular menstruation, unspecified: Secondary | ICD-10-CM | POA: Diagnosis not present

## 2022-03-13 ENCOUNTER — Other Ambulatory Visit (HOSPITAL_COMMUNITY): Payer: Self-pay

## 2022-03-13 MED ORDER — GABAPENTIN 300 MG PO CAPS
300.0000 mg | ORAL_CAPSULE | Freq: Every evening | ORAL | 0 refills | Status: AC
  Filled 2022-03-13: qty 30, 30d supply, fill #0

## 2022-03-14 ENCOUNTER — Ambulatory Visit (HOSPITAL_COMMUNITY): Payer: 59

## 2022-03-14 ENCOUNTER — Other Ambulatory Visit (HOSPITAL_COMMUNITY): Payer: Self-pay

## 2022-03-21 ENCOUNTER — Ambulatory Visit (HOSPITAL_COMMUNITY)
Admission: RE | Admit: 2022-03-21 | Discharge: 2022-03-21 | Disposition: A | Discharge status: Home / Self Care | Payer: 59 | Source: Ambulatory Visit | Attending: Obstetrics and Gynecology | Admitting: Obstetrics and Gynecology

## 2022-03-21 DIAGNOSIS — Z1239 Encounter for other screening for malignant neoplasm of breast: Secondary | ICD-10-CM | POA: Diagnosis not present

## 2022-03-21 DIAGNOSIS — Z803 Family history of malignant neoplasm of breast: Secondary | ICD-10-CM | POA: Insufficient documentation

## 2022-03-21 DIAGNOSIS — N6489 Other specified disorders of breast: Secondary | ICD-10-CM | POA: Diagnosis not present

## 2022-03-21 DIAGNOSIS — Z1231 Encounter for screening mammogram for malignant neoplasm of breast: Secondary | ICD-10-CM | POA: Diagnosis not present

## 2022-03-21 MED ORDER — GADOBUTROL 1 MMOL/ML IV SOLN
6.5000 mL | Freq: Once | INTRAVENOUS | Status: AC | PRN
  Administered 2022-03-21: 6.5 mL via INTRAVENOUS

## 2022-04-03 DIAGNOSIS — Z8041 Family history of malignant neoplasm of ovary: Secondary | ICD-10-CM | POA: Diagnosis not present

## 2022-04-03 DIAGNOSIS — Z1501 Genetic susceptibility to malignant neoplasm of breast: Secondary | ICD-10-CM | POA: Diagnosis not present

## 2022-04-03 DIAGNOSIS — Z130 Encounter for screening for diseases of the blood and blood-forming organs and certain disorders involving the immune mechanism: Secondary | ICD-10-CM | POA: Diagnosis not present

## 2022-04-03 DIAGNOSIS — Z13220 Encounter for screening for lipoid disorders: Secondary | ICD-10-CM | POA: Diagnosis not present

## 2022-04-03 DIAGNOSIS — Z131 Encounter for screening for diabetes mellitus: Secondary | ICD-10-CM | POA: Diagnosis not present

## 2022-04-03 DIAGNOSIS — Z1321 Encounter for screening for nutritional disorder: Secondary | ICD-10-CM | POA: Diagnosis not present

## 2022-04-03 DIAGNOSIS — Z13228 Encounter for screening for other metabolic disorders: Secondary | ICD-10-CM | POA: Diagnosis not present

## 2022-04-03 DIAGNOSIS — Z1273 Encounter for screening for malignant neoplasm of ovary: Secondary | ICD-10-CM | POA: Diagnosis not present

## 2022-04-03 DIAGNOSIS — Z803 Family history of malignant neoplasm of breast: Secondary | ICD-10-CM | POA: Diagnosis not present

## 2022-04-03 DIAGNOSIS — Z1329 Encounter for screening for other suspected endocrine disorder: Secondary | ICD-10-CM | POA: Diagnosis not present

## 2022-04-03 DIAGNOSIS — Z1502 Genetic susceptibility to malignant neoplasm of ovary: Secondary | ICD-10-CM | POA: Diagnosis not present

## 2022-09-26 DIAGNOSIS — R5383 Other fatigue: Secondary | ICD-10-CM | POA: Diagnosis not present

## 2022-10-25 DIAGNOSIS — Z8041 Family history of malignant neoplasm of ovary: Secondary | ICD-10-CM | POA: Diagnosis not present

## 2022-10-25 DIAGNOSIS — R898 Other abnormal findings in specimens from other organs, systems and tissues: Secondary | ICD-10-CM | POA: Diagnosis not present

## 2022-10-25 DIAGNOSIS — Z803 Family history of malignant neoplasm of breast: Secondary | ICD-10-CM | POA: Diagnosis not present

## 2022-12-24 ENCOUNTER — Other Ambulatory Visit (HOSPITAL_COMMUNITY): Payer: Self-pay

## 2022-12-24 DIAGNOSIS — L738 Other specified follicular disorders: Secondary | ICD-10-CM | POA: Diagnosis not present

## 2022-12-24 DIAGNOSIS — L723 Sebaceous cyst: Secondary | ICD-10-CM | POA: Diagnosis not present

## 2022-12-24 MED ORDER — DOXYCYCLINE HYCLATE 100 MG PO TABS
100.0000 mg | ORAL_TABLET | Freq: Two times a day (BID) | ORAL | 2 refills | Status: AC
  Filled 2022-12-24: qty 60, 30d supply, fill #0
  Filled 2023-02-07: qty 60, 30d supply, fill #1

## 2022-12-24 MED ORDER — CLINDAMYCIN PHOSPHATE 1 % EX LOTN
TOPICAL_LOTION | Freq: Two times a day (BID) | CUTANEOUS | 2 refills | Status: AC
Start: 2022-12-24 — End: ?
  Filled 2022-12-24: qty 60, 30d supply, fill #0
  Filled 2023-02-07: qty 60, 30d supply, fill #1

## 2023-01-01 ENCOUNTER — Other Ambulatory Visit (HOSPITAL_COMMUNITY): Payer: Self-pay

## 2023-01-01 MED ORDER — FLUCONAZOLE 150 MG PO TABS
150.0000 mg | ORAL_TABLET | Freq: Every day | ORAL | 0 refills | Status: AC
  Filled 2023-01-01: qty 2, 2d supply, fill #0

## 2023-01-21 ENCOUNTER — Other Ambulatory Visit: Payer: Self-pay | Admitting: Obstetrics and Gynecology

## 2023-01-21 DIAGNOSIS — Z1231 Encounter for screening mammogram for malignant neoplasm of breast: Secondary | ICD-10-CM

## 2023-01-31 ENCOUNTER — Other Ambulatory Visit: Payer: Self-pay | Admitting: Obstetrics and Gynecology

## 2023-01-31 DIAGNOSIS — Z1231 Encounter for screening mammogram for malignant neoplasm of breast: Secondary | ICD-10-CM

## 2023-02-06 DIAGNOSIS — L738 Other specified follicular disorders: Secondary | ICD-10-CM | POA: Diagnosis not present

## 2023-02-13 ENCOUNTER — Ambulatory Visit
Admission: RE | Admit: 2023-02-13 | Discharge: 2023-02-13 | Disposition: A | Discharge status: Home / Self Care | Payer: Commercial Managed Care - PPO | Source: Ambulatory Visit

## 2023-02-13 DIAGNOSIS — Z1231 Encounter for screening mammogram for malignant neoplasm of breast: Secondary | ICD-10-CM

## 2023-02-15 DIAGNOSIS — M79641 Pain in right hand: Secondary | ICD-10-CM | POA: Diagnosis not present

## 2023-02-26 ENCOUNTER — Ambulatory Visit: Payer: Commercial Managed Care - PPO

## 2023-05-13 ENCOUNTER — Other Ambulatory Visit (HOSPITAL_COMMUNITY): Payer: Self-pay

## 2023-05-13 MED ORDER — METRONIDAZOLE 0.75 % VA GEL
VAGINAL | 0 refills | Status: DC
Start: 2023-05-13 — End: 2023-09-12
  Filled 2023-05-13: qty 70, 5d supply, fill #0

## 2023-05-13 MED ORDER — FLUCONAZOLE 150 MG PO TABS
150.0000 mg | ORAL_TABLET | ORAL | 0 refills | Status: DC
  Filled 2023-05-13: qty 2, 6d supply, fill #0

## 2023-05-13 MED ORDER — NYSTATIN-TRIAMCINOLONE 100000-0.1 UNIT/GM-% EX OINT
TOPICAL_OINTMENT | Freq: Two times a day (BID) | CUTANEOUS | 0 refills | Status: DC | PRN
  Filled 2023-05-13: qty 30, 15d supply, fill #0

## 2023-05-14 ENCOUNTER — Other Ambulatory Visit (HOSPITAL_COMMUNITY): Payer: Self-pay

## 2023-09-12 ENCOUNTER — Other Ambulatory Visit (HOSPITAL_COMMUNITY): Payer: Self-pay

## 2023-09-12 MED ORDER — CITALOPRAM HYDROBROMIDE 10 MG PO TABS
10.0000 mg | ORAL_TABLET | Freq: Every day | ORAL | 3 refills | Status: AC
Start: 2023-09-12 — End: ?
  Filled 2023-09-12: qty 30, 30d supply, fill #0

## 2023-09-19 ENCOUNTER — Other Ambulatory Visit (HOSPITAL_COMMUNITY): Payer: Self-pay

## 2023-10-08 ENCOUNTER — Other Ambulatory Visit (HOSPITAL_COMMUNITY): Payer: Self-pay

## 2023-10-08 MED ORDER — CITALOPRAM HYDROBROMIDE 10 MG PO TABS: 10.0000 mg | ORAL_TABLET | Freq: Every day | ORAL | 3 refills | Status: DC

## 2023-12-30 ENCOUNTER — Other Ambulatory Visit (HOSPITAL_COMMUNITY): Payer: Self-pay

## 2023-12-30 MED ORDER — CICLOPIROX OLAMINE 0.77 % EX CREA
1.0000 | TOPICAL_CREAM | Freq: Two times a day (BID) | CUTANEOUS | 1 refills | Status: AC
  Filled 2023-12-30: qty 30, 15d supply, fill #0

## 2024-02-13 ENCOUNTER — Other Ambulatory Visit: Payer: Self-pay | Admitting: Obstetrics and Gynecology

## 2024-02-13 DIAGNOSIS — Z1231 Encounter for screening mammogram for malignant neoplasm of breast: Secondary | ICD-10-CM

## 2024-02-19 ENCOUNTER — Ambulatory Visit
Admission: RE | Admit: 2024-02-19 | Discharge: 2024-02-19 | Discharge status: Home / Self Care | Source: Ambulatory Visit | Attending: Obstetrics and Gynecology | Admitting: Obstetrics and Gynecology

## 2024-02-19 DIAGNOSIS — Z1231 Encounter for screening mammogram for malignant neoplasm of breast: Secondary | ICD-10-CM

## 2024-08-24 ENCOUNTER — Other Ambulatory Visit: Payer: Self-pay | Admitting: Family

## 2024-08-24 DIAGNOSIS — N6313 Unspecified lump in the right breast, lower outer quadrant: Secondary | ICD-10-CM

## 2024-08-25 ENCOUNTER — Inpatient Hospital Stay: Admission: RE | Admit: 2024-08-25 | Discharge: 2024-08-25 | Attending: Family | Admitting: Family

## 2024-08-25 DIAGNOSIS — N6313 Unspecified lump in the right breast, lower outer quadrant: Secondary | ICD-10-CM
